# Patient Record
Sex: Female | Born: 1975 | Race: Black or African American | Hispanic: No | Marital: Married | Smoking: Never smoker
Health system: Southern US, Community
[De-identification: ages and names within clinical notes are randomized; demographics above are authoritative.]

## PROBLEM LIST (undated history)

## (undated) DIAGNOSIS — D219 Benign neoplasm of connective and other soft tissue, unspecified: Secondary | ICD-10-CM

## (undated) DIAGNOSIS — F419 Anxiety disorder, unspecified: Secondary | ICD-10-CM

## (undated) DIAGNOSIS — K219 Gastro-esophageal reflux disease without esophagitis: Secondary | ICD-10-CM

## (undated) DIAGNOSIS — R519 Headache, unspecified: Secondary | ICD-10-CM

## (undated) DIAGNOSIS — J302 Other seasonal allergic rhinitis: Secondary | ICD-10-CM

## (undated) DIAGNOSIS — R51 Headache: Secondary | ICD-10-CM

## (undated) DIAGNOSIS — Z8759 Personal history of other complications of pregnancy, childbirth and the puerperium: Secondary | ICD-10-CM

## (undated) DIAGNOSIS — R109 Unspecified abdominal pain: Secondary | ICD-10-CM

## (undated) HISTORY — DX: Personal history of other complications of pregnancy, childbirth and the puerperium: Z87.59

## (undated) HISTORY — DX: Unspecified abdominal pain: R10.9

## (undated) HISTORY — PX: UPPER GI ENDOSCOPY: SHX6162

## (undated) HISTORY — DX: Benign neoplasm of connective and other soft tissue, unspecified: D21.9

## (undated) HISTORY — PX: WISDOM TOOTH EXTRACTION: SHX21

## (undated) HISTORY — PX: HAND SURGERY: SHX662

---

## 2009-11-28 ENCOUNTER — Emergency Department (HOSPITAL_COMMUNITY): Admission: EM | Admit: 2009-11-28 | Discharge: 2009-11-28 | Payer: Self-pay | Admitting: Emergency Medicine

## 2013-07-18 HISTORY — PX: DILATION AND CURETTAGE OF UTERUS: SHX78

## 2016-01-26 ENCOUNTER — Encounter: Payer: Self-pay | Admitting: *Deleted

## 2016-01-28 ENCOUNTER — Encounter: Payer: Self-pay | Admitting: Obstetrics and Gynecology

## 2016-02-05 ENCOUNTER — Encounter: Payer: Medicaid Other | Admitting: Obstetrics and Gynecology

## 2016-06-22 ENCOUNTER — Emergency Department (HOSPITAL_COMMUNITY): Payer: Self-pay

## 2016-06-22 ENCOUNTER — Encounter (HOSPITAL_COMMUNITY): Payer: Self-pay | Admitting: Emergency Medicine

## 2016-06-22 ENCOUNTER — Emergency Department (HOSPITAL_COMMUNITY)
Admission: EM | Admit: 2016-06-22 | Discharge: 2016-06-22 | Disposition: A | Discharge status: Home / Self Care | Payer: Self-pay | Attending: Emergency Medicine | Admitting: Emergency Medicine

## 2016-06-22 DIAGNOSIS — Z79899 Other long term (current) drug therapy: Secondary | ICD-10-CM | POA: Insufficient documentation

## 2016-06-22 DIAGNOSIS — R0789 Other chest pain: Secondary | ICD-10-CM | POA: Insufficient documentation

## 2016-06-22 LAB — LIPASE, BLOOD: Lipase: 17 U/L (ref 11–51)

## 2016-06-22 LAB — COMPREHENSIVE METABOLIC PANEL
ALT: 17 U/L (ref 14–54)
AST: 16 U/L (ref 15–41)
Albumin: 4.3 g/dL (ref 3.5–5.0)
Alkaline Phosphatase: 65 U/L (ref 38–126)
Anion gap: 8 (ref 5–15)
BUN: 14 mg/dL (ref 6–20)
CO2: 24 mmol/L (ref 22–32)
Calcium: 9.6 mg/dL (ref 8.9–10.3)
Chloride: 107 mmol/L (ref 101–111)
Creatinine, Ser: 0.95 mg/dL (ref 0.44–1.00)
GFR calc Af Amer: >60 mL/min (ref >60)
GFR calc non Af Amer: >60 mL/min (ref >60)
Glucose, Bld: 102 mg/dL — ABNORMAL HIGH (ref 65–99)
Potassium: 3.8 mmol/L (ref 3.5–5.1)
Sodium: 139 mmol/L (ref 135–145)
Total Bilirubin: 0.4 mg/dL (ref 0.3–1.2)
Total Protein: 8.1 g/dL (ref 6.5–8.1)

## 2016-06-22 LAB — CBC WITH DIFFERENTIAL/PLATELET
Basophils Absolute: 0 10*3/uL (ref 0.0–0.1)
Basophils Relative: 1 %
Eosinophils Absolute: 0.1 10*3/uL (ref 0.0–0.7)
Eosinophils Relative: 1 %
HCT: 41.8 % (ref 36.0–46.0)
Hemoglobin: 14 g/dL (ref 12.0–15.0)
Lymphocytes Relative: 41 %
Lymphs Abs: 2.4 10*3/uL (ref 0.7–4.0)
MCH: 30 pg (ref 26.0–34.0)
MCHC: 33.5 g/dL (ref 30.0–36.0)
MCV: 89.5 fL (ref 78.0–100.0)
Monocytes Absolute: 0.5 10*3/uL (ref 0.1–1.0)
Monocytes Relative: 8 %
Neutro Abs: 2.9 10*3/uL (ref 1.7–7.7)
Neutrophils Relative %: 49 %
Platelets: 235 10*3/uL (ref 150–400)
RBC: 4.67 MIL/uL (ref 3.87–5.11)
RDW: 13 % (ref 11.5–15.5)
WBC: 5.9 10*3/uL (ref 4.0–10.5)

## 2016-06-22 MED ORDER — ACETAMINOPHEN 325 MG PO TABS
650.0000 mg | ORAL_TABLET | Freq: Once | ORAL | Status: AC
  Administered 2016-06-22: 650 mg via ORAL
  Filled 2016-06-22: qty 2

## 2016-06-22 MED ORDER — GI COCKTAIL ~~LOC~~
30.0000 mL | Freq: Once | ORAL | Status: AC
  Administered 2016-06-22: 30 mL via ORAL
  Filled 2016-06-22: qty 30

## 2016-06-22 MED ORDER — OMEPRAZOLE 20 MG PO CPDR: 20.0000 mg | DELAYED_RELEASE_CAPSULE | Freq: Every day | ORAL | 0 refills | Status: DC

## 2016-06-22 MED ORDER — FAMOTIDINE-CA CARB-MAG HYDROX 10-800-165 MG PO CHEW: 1.0000 | CHEWABLE_TABLET | Freq: Two times a day (BID) | ORAL | 0 refills | Status: DC | PRN

## 2016-06-22 NOTE — ED Provider Notes (Signed)
[facility] DEPT Provider Note   CSN: [id number] Arrival date & time: 06/22/16  1754     History   Chief Complaint Chief Complaint  Patient presents with  . Gastroesophageal Reflux    HPI Maria Stein is a 40 y.o. female.  HPI  Patient presents with concern of chest pain. The pain is sternal, sore, radiating superiorly.  The pain has been present for about 4 days, and with some associated pain about her axilla, patient went to urgent care 2 days ago. She was started on Tamiflu. She notes that other than initiation of nausea, generalized discomfort, she has been otherwise in a similar state affairs. No fever, no chills, though the patient states she is always cold. No other new pain, no confusion, disorientation, syncope.    Past Medical History:  Diagnosis Date  . Abdominal pain   . Fibroids   . History of miscarriage     There are no active problems to display for this patient.   Past Surgical History:  Procedure Laterality Date  . DILATION AND CURETTAGE OF UTERUS  2015  . HAND SURGERY      OB History    Gravida Para Term Preterm AB Living   1       1 0   SAB TAB Ectopic Multiple Live Births   1               Home Medications    Prior to Admission medications   Medication Sig Start Date End Date Taking? Authorizing Provider  medroxyPROGESTERone (DEPO-PROVERA) 150 MG/ML injection Inject 150 mg into the muscle every 3 (three) months.    Historical Provider, MD  omeprazole (PRILOSEC) 20 MG capsule Take 20 mg by mouth 2 (two) times daily before a meal.    Historical Provider, MD  promethazine (PHENERGAN) 25 MG tablet Take 25 mg by mouth every 6 (six) hours as needed for nausea or vomiting.    Historical Provider, MD    Family History Family History  Problem Relation Age of Onset  . Heart disease Father   . Hypertension Father   . Diabetes Father     Social History Social History  Substance Use Topics  . Smoking status: Never Smoker  .  Smokeless tobacco: Never Used  . Alcohol use No     Allergies   Hydrocodone   Review of Systems Review of Systems  Constitutional:       Per HPI, otherwise negative  HENT:       Per HPI, otherwise negative  Respiratory:       Per HPI, otherwise negative  Cardiovascular:       Per HPI, otherwise negative  Gastrointestinal: Negative for vomiting.  Endocrine:       Negative aside from HPI  Genitourinary:       Neg aside from HPI   Musculoskeletal:       Per HPI, otherwise negative  Skin: Negative.   Neurological: Negative for syncope.     Physical Exam Updated Vital Signs BP 145/96 (BP Location: Left Arm)   Pulse 92   Temp 98 F (36.7 C) (Oral)   Resp 18   Ht 5\' 6"  (1.676 m)   Wt 180 lb (81.6 kg)   SpO2 100%   BMI 29.05 kg/m   Physical Exam  Constitutional: She is oriented to person, place, and time. She appears well-developed and well-nourished. No distress.  HENT:  Head: Normocephalic and atraumatic.  Eyes: Conjunctivae and EOM are normal.  Cardiovascular:  Normal rate and regular rhythm.   Pulmonary/Chest: Effort normal and breath sounds normal. No stridor. No respiratory distress.  Abdominal: She exhibits no distension.  Musculoskeletal: She exhibits no edema.  Neurological: She is alert and oriented to person, place, and time. No cranial nerve deficit.  Skin: Skin is warm and dry.  Psychiatric: She has a normal mood and affect.  Nursing note and vitals reviewed.    ED Treatments / Results  Labs (all labs ordered are listed, but only abnormal results are displayed) Labs Reviewed  COMPREHENSIVE METABOLIC PANEL - Abnormal; Notable for the following:       Result Value   Glucose, Bld 102 (*)    All other components within normal limits  LIPASE, BLOOD  CBC WITH DIFFERENTIAL/PLATELET    EKG  EKG Interpretation  Date/Time:  Wednesday June 22 2016 18:46:21 EST Ventricular Rate:  86 PR Interval:    QRS Duration: 68 QT Interval:  335 QTC  Calculation: 401 R Axis:   64 Text Interpretation:  Sinus rhythm Biatrial enlargement Otherwise within normal limits Confirmed by Carmin Muskrat  MD [phone]) on 06/22/2016 6:55:11 PM       Radiology Dg Chest 2 View  Result Date: 06/22/2016 CLINICAL DATA:  Chest pain EXAM: CHEST  2 VIEW COMPARISON:  09/11/2015 FINDINGS: The heart size and mediastinal contours are within normal limits. Both lungs are clear. The visualized skeletal structures are unremarkable. IMPRESSION: No active cardiopulmonary disease. Electronically Signed   By: Franchot Gallo M.D.   On: 06/22/2016 19:47    Procedures Procedures (including critical care time)  Medications Ordered in ED Medications  gi cocktail (Maalox,Lidocaine,Donnatal) (not administered)     Initial Impression / Assessment and Plan / ED Course  I have reviewed the triage vital signs and the nursing notes.  Pertinent labs & imaging results that were available during my care of the patient were reviewed by me and considered in my medical decision making (see chart for details).  Clinical Course   On repeat exam patient is awake and alert in no distress. She is speaking clearly, states that she feels somewhat better after GI cocktail. We discussed all findings at length, including reassuring labs, EKG, x-ray. With description of sternal and epigastric discomfort, reassuring labs, empiric therapy for gastroesophageal irritation will start. Patient will follow up with GI, primary care.   Final Clinical Impressions(s) / ED Diagnoses  Atypical chest pain   Carmin Muskrat, MD 06/22/16 2052

## 2016-06-22 NOTE — Discharge Instructions (Signed)
As discussed, your evaluation today has been largely reassuring.  But, it is important that you monitor your condition carefully, and do not hesitate to return to the ED if you develop new, or concerning changes in your condition.  Please take all medication as directed and be sure to follow-up with your primary care physician and our gastroenterologist as needed.

## 2016-06-22 NOTE — ED Triage Notes (Signed)
Upper gastric pain for about 4 days.  History GI problem in Feb 2017.  Pain in neck and upper gastric and around to back.  Rates pain 8/10, but pain comes and goes.

## 2016-06-25 ENCOUNTER — Emergency Department (HOSPITAL_COMMUNITY): Payer: Self-pay

## 2016-06-25 ENCOUNTER — Emergency Department (HOSPITAL_COMMUNITY)
Admission: EM | Admit: 2016-06-25 | Discharge: 2016-06-25 | Disposition: A | Discharge status: Home / Self Care | Payer: Self-pay | Attending: Emergency Medicine | Admitting: Emergency Medicine

## 2016-06-25 ENCOUNTER — Encounter (HOSPITAL_COMMUNITY): Payer: Self-pay | Admitting: *Deleted

## 2016-06-25 DIAGNOSIS — R110 Nausea: Secondary | ICD-10-CM | POA: Insufficient documentation

## 2016-06-25 DIAGNOSIS — R109 Unspecified abdominal pain: Secondary | ICD-10-CM

## 2016-06-25 DIAGNOSIS — R011 Cardiac murmur, unspecified: Secondary | ICD-10-CM | POA: Insufficient documentation

## 2016-06-25 DIAGNOSIS — R197 Diarrhea, unspecified: Secondary | ICD-10-CM | POA: Insufficient documentation

## 2016-06-25 DIAGNOSIS — Z79899 Other long term (current) drug therapy: Secondary | ICD-10-CM | POA: Insufficient documentation

## 2016-06-25 DIAGNOSIS — R1013 Epigastric pain: Secondary | ICD-10-CM | POA: Insufficient documentation

## 2016-06-25 DIAGNOSIS — R1033 Periumbilical pain: Secondary | ICD-10-CM | POA: Insufficient documentation

## 2016-06-25 LAB — CBC WITH DIFFERENTIAL/PLATELET
Basophils Absolute: 0 10*3/uL (ref 0.0–0.1)
Basophils Relative: 0 %
Eosinophils Absolute: 0 10*3/uL (ref 0.0–0.7)
Eosinophils Relative: 0 %
HCT: 41.1 % (ref 36.0–46.0)
Hemoglobin: 13.8 g/dL (ref 12.0–15.0)
Lymphocytes Relative: 31 %
Lymphs Abs: 1.4 10*3/uL (ref 0.7–4.0)
MCH: 30 pg (ref 26.0–34.0)
MCHC: 33.6 g/dL (ref 30.0–36.0)
MCV: 89.3 fL (ref 78.0–100.0)
Monocytes Absolute: 0.3 10*3/uL (ref 0.1–1.0)
Monocytes Relative: 6 %
Neutro Abs: 2.8 10*3/uL (ref 1.7–7.7)
Neutrophils Relative %: 63 %
Platelets: 226 10*3/uL (ref 150–400)
RBC: 4.6 MIL/uL (ref 3.87–5.11)
RDW: 12.9 % (ref 11.5–15.5)
WBC: 4.4 10*3/uL (ref 4.0–10.5)

## 2016-06-25 LAB — URINALYSIS, ROUTINE W REFLEX MICROSCOPIC
Bilirubin Urine: NEGATIVE
Glucose, UA: NEGATIVE mg/dL
Ketones, ur: 20 mg/dL — AB
Nitrite: NEGATIVE
Protein, ur: NEGATIVE mg/dL
Specific Gravity, Urine: 1.017 (ref 1.005–1.030)
pH: 6 (ref 5.0–8.0)

## 2016-06-25 LAB — COMPREHENSIVE METABOLIC PANEL
ALT: 17 U/L (ref 14–54)
AST: 18 U/L (ref 15–41)
Albumin: 4.6 g/dL (ref 3.5–5.0)
Alkaline Phosphatase: 69 U/L (ref 38–126)
Anion gap: 10 (ref 5–15)
BUN: 14 mg/dL (ref 6–20)
CO2: 24 mmol/L (ref 22–32)
Calcium: 9.9 mg/dL (ref 8.9–10.3)
Chloride: 105 mmol/L (ref 101–111)
Creatinine, Ser: 1.02 mg/dL — ABNORMAL HIGH (ref 0.44–1.00)
GFR calc Af Amer: >60 mL/min (ref >60)
GFR calc non Af Amer: >60 mL/min (ref >60)
Glucose, Bld: 100 mg/dL — ABNORMAL HIGH (ref 65–99)
Potassium: 4 mmol/L (ref 3.5–5.1)
Sodium: 139 mmol/L (ref 135–145)
Total Bilirubin: 1 mg/dL (ref 0.3–1.2)
Total Protein: 8.1 g/dL (ref 6.5–8.1)

## 2016-06-25 LAB — LIPASE, BLOOD: Lipase: 17 U/L (ref 11–51)

## 2016-06-25 LAB — POC OCCULT BLOOD, ED: Fecal Occult Bld: NEGATIVE

## 2016-06-25 LAB — TROPONIN I: Troponin I: <0.03 ng/mL (ref <0.03)

## 2016-06-25 MED ORDER — GI COCKTAIL ~~LOC~~
30.0000 mL | Freq: Once | ORAL | Status: AC
  Administered 2016-06-25: 30 mL via ORAL
  Filled 2016-06-25: qty 30

## 2016-06-25 MED ORDER — PROMETHAZINE HCL 12.5 MG PO TABS: 12.5000 mg | ORAL_TABLET | Freq: Four times a day (QID) | ORAL | 0 refills | Status: DC | PRN

## 2016-06-25 MED ORDER — ONDANSETRON HCL 4 MG/2ML IJ SOLN
4.0000 mg | Freq: Once | INTRAMUSCULAR | Status: AC
  Administered 2016-06-25: 4 mg via INTRAVENOUS
  Filled 2016-06-25: qty 2

## 2016-06-25 MED ORDER — PANTOPRAZOLE SODIUM 40 MG PO TBEC
40.0000 mg | DELAYED_RELEASE_TABLET | Freq: Once | ORAL | Status: AC
  Administered 2016-06-25: 40 mg via ORAL
  Filled 2016-06-25: qty 1

## 2016-06-25 NOTE — ED Triage Notes (Signed)
Pt reports she was seen here Thursday for CP and diagnosed with GERD. Pt reports the pain hasn't gotten any better with the OTC Prilosec and she has started having abdominal and neck pain, decreased appetite, nausea. Pt denies vomiting.

## 2016-06-25 NOTE — ED Provider Notes (Signed)
[facility] DEPT Provider Note   CSN: [id number] Arrival date & time: 06/25/16  0802     History   Chief Complaint Chief Complaint  Patient presents with  . Abdominal Pain    HPI Maria Stein is a 40 y.o. female.  Patient is a 40 year old female who presents to the emergency department with a complaint of abdominal discomfort.  The patient states this problem is been going on for over a week. She started out having abdominal pain and then some chest discomfort. She describes a burning pain, a cramping type pain, and at times a pulling type sensation from her neck into her chest. She describes a burning and cramping type sensation that is diffuse throughout the abdomen. She has frequent bouts with nausea. She denies palpitations, shortness of breath, loss of consciousness. It is of note that the patient states that she uses ibuprofen occasionally, but only describes using 2 or 3 tablets a week. She denies use of any aspirin or Goody powders. She's not had any injury or trauma to the abdomen. She's not had any previous operations to the upper abdomen. She does report some diarrhea, but notes that this was after using Pepcid and Prilosec. She feels as though she is unable to eat. She has a poor appetite even though she is hungry. She was seen in the emergency department 3 days ago. She was diagnosed with esophageal reflux disease. She was placed on Pepcid and Prilosec. She was also seen by her primary physician this week, her blood pressure was noted to be elevated at that time. No changes were made in her medication, and no additional medicines were ordered concerning her abdomen and chest pain. She continues to have nausea this morning. The patient states she feels as though she is getting worse instead of getting better and she presents to the emergency department for additional evaluation and management.      Past Medical History:  Diagnosis Date  . Abdominal pain   . Fibroids     . History of miscarriage     There are no active problems to display for this patient.   Past Surgical History:  Procedure Laterality Date  . DILATION AND CURETTAGE OF UTERUS  2015  . HAND SURGERY      OB History    Gravida Para Term Preterm AB Living   1       1 0   SAB TAB Ectopic Multiple Live Births   1               Home Medications    Prior to Admission medications   Medication Sig Start Date End Date Taking? Authorizing Provider  calcium carbonate (TUMS - DOSED IN MG ELEMENTAL CALCIUM) 500 MG chewable tablet Chew 1 tablet by mouth daily as needed for indigestion or heartburn.    Historical Provider, MD  famotidine-calcium carbonate-magnesium hydroxide (PEPCID COMPLETE) 10-800-165 MG chewable tablet Chew 1 tablet by mouth 2 (two) times daily as needed. 06/22/16 06/29/16  Carmin Muskrat, MD  medroxyPROGESTERone (DEPO-PROVERA) 150 MG/ML injection Inject 150 mg into the muscle every 3 (three) months.    Historical Provider, MD  Multiple Vitamin (MULTIVITAMIN WITH MINERALS) TABS tablet Take 1 tablet by mouth daily.    Historical Provider, MD  omeprazole (PRILOSEC) 20 MG capsule Take 1 capsule (20 mg total) by mouth daily. Take one tablet daily 06/22/16   Carmin Muskrat, MD    Family History Family History  Problem Relation Age of Onset  . Heart  disease Father   . Hypertension Father   . Diabetes Father     Social History Social History  Substance Use Topics  . Smoking status: Never Smoker  . Smokeless tobacco: Never Used  . Alcohol use No     Allergies   Hydrocodone   Review of Systems Review of Systems  Gastrointestinal: Positive for abdominal pain, diarrhea and nausea.  All other systems reviewed and are negative.    Physical Exam Updated Vital Signs BP (!) 162/102   Pulse 91   Temp 97.7 F (36.5 C) (Oral)   Resp 16   Ht 5\' 6"  (1.676 m)   Wt 81.6 kg   SpO2 97%   BMI 29.05 kg/m   Physical Exam  Constitutional: She is oriented to person,  place, and time. She appears well-developed and well-nourished.  Non-toxic appearance.  HENT:  Head: Normocephalic.  Right Ear: Tympanic membrane and external ear normal.  Left Ear: Tympanic membrane and external ear normal.  Eyes: EOM and lids are normal. Pupils are equal, round, and reactive to light.  Neck: Normal range of motion. Neck supple. Carotid bruit is not present.  Cardiovascular: Normal rate, regular rhythm, intact distal pulses and normal pulses.  Exam reveals no gallop and no friction rub.   Murmur heard.  Systolic murmur is present with a grade of 2/6  Pulmonary/Chest: Breath sounds normal. No respiratory distress. She has no wheezes. She has no rales.  Abdominal: Soft. Bowel sounds are normal. She exhibits no distension, no fluid wave, no abdominal bruit, no ascites and no mass. There is tenderness in the epigastric area and periumbilical area. There is no guarding.    Genitourinary:  Genitourinary Comments: Chaperone present during the examination.  No rash or external hemorrhoid or lesions noted in the anal area. No palpable mass of the sphincter of the anal area. No blood on my gloved finger. Stool is negative for occult blood.  Musculoskeletal: Normal range of motion. She exhibits no edema or tenderness.  No Edema. Neg Homan's sign.  Lymphadenopathy:       Head (right side): No submandibular adenopathy present.       Head (left side): No submandibular adenopathy present.    She has no cervical adenopathy.  Neurological: She is alert and oriented to person, place, and time. She has normal strength. No cranial nerve deficit or sensory deficit.  Skin: Skin is warm and dry.  Psychiatric: She has a normal mood and affect. Her speech is normal.  Nursing note and vitals reviewed.    ED Treatments / Results  Labs (all labs ordered are listed, but only abnormal results are displayed) Labs Reviewed  CBC WITH DIFFERENTIAL/PLATELET  COMPREHENSIVE METABOLIC PANEL  LIPASE,  BLOOD  URINALYSIS, ROUTINE W REFLEX MICROSCOPIC  TROPONIN I    EKG  EKG Interpretation None       Radiology No results found.  Procedures Procedures (including critical care time)  Medications Ordered in ED Medications  ondansetron (ZOFRAN) injection 4 mg (not administered)     Initial Impression / Assessment and Plan / ED Course  I have reviewed the triage vital signs and the nursing notes.  Pertinent labs & imaging results that were available during my care of the patient were reviewed by me and considered in my medical decision making (see chart for details).  Clinical Course     **I have reviewed nursing notes, vital signs, and all appropriate lab and imaging results for this patient.*  Final Clinical Impressions(s) / ED  Diagnoses  Blood pressure is elevated at 162/102, otherwise the vital signs within normal limits. Pulse oximetry is 97% on room air.  Complete blood count is well within normal limits.  Blood pressure improved to 130/85-140/89.   The creatinine is slightly elevated at 1.02, otherwise the comprehensive metabolic panel is within normal limits. The lipase is normal at 17. The troponin is negative for acute event at less then 0.03.  I discussed the radiographic, laboratory, and clinical findings with the patient in terms which she understands. Questions were answered. The patient states that she feels some better, but still has discomfort on. The patient will be given a GI cocktail and a Protonix tablet here in the emergency department. I've asked the patient to continue her Pepcid and Prilosec. Will add a promethazine every 6 hours. I've asked the patient to see the GI specialist Dr. Oneida Alar for additional evaluation concerning this abdominal discomfort. Feel that it is safe for the patient to be discharged home. Patient is invited to return to the emergency department if any changes, problems, or concerns.    Final diagnoses:  Abdominal pain,  unspecified abdominal location    New Prescriptions New Prescriptions   No medications on file     Lily Kocher, PA-C 06/25/16 [place], MD 06/26/16 [phone]

## 2016-06-25 NOTE — Discharge Instructions (Signed)
Your vital signs within normal limits. Your oxygen level is 100% on room air. The complete blood count, cardiac enzyme, and lipase test are all negative. The ultrasound is negative for gallstones. Your bile ducts seem to be non-distended, and within normal limits. Please continue the Pepcid and Prilosec. Please add Phenergan for nausea. Phenergan may cause drowsiness, please do not drive, drink alcohol, operate machinery, or participate in activities requiring concentration when taking this medication. Please see Dr.Fields to complete your workup of your abdominal pain.

## 2016-07-18 ENCOUNTER — Encounter (HOSPITAL_COMMUNITY): Payer: Self-pay

## 2016-07-18 ENCOUNTER — Emergency Department (HOSPITAL_COMMUNITY): Payer: BLUE CROSS/BLUE SHIELD

## 2016-07-18 ENCOUNTER — Emergency Department (HOSPITAL_COMMUNITY)
Admission: EM | Admit: 2016-07-18 | Discharge: 2016-07-19 | Disposition: A | Discharge status: Home / Self Care | Payer: BLUE CROSS/BLUE SHIELD | Attending: Emergency Medicine | Admitting: Emergency Medicine

## 2016-07-18 DIAGNOSIS — R079 Chest pain, unspecified: Secondary | ICD-10-CM | POA: Diagnosis present

## 2016-07-18 DIAGNOSIS — Z79899 Other long term (current) drug therapy: Secondary | ICD-10-CM | POA: Insufficient documentation

## 2016-07-18 DIAGNOSIS — R0789 Other chest pain: Secondary | ICD-10-CM | POA: Insufficient documentation

## 2016-07-18 LAB — CBC
HCT: 43 % (ref 36.0–46.0)
Hemoglobin: 15 g/dL (ref 12.0–15.0)
MCH: 29.8 pg (ref 26.0–34.0)
MCHC: 34.9 g/dL (ref 30.0–36.0)
MCV: 85.5 fL (ref 78.0–100.0)
Platelets: 185 10*3/uL (ref 150–400)
RBC: 5.03 MIL/uL (ref 3.87–5.11)
RDW: 12.6 % (ref 11.5–15.5)
WBC: 4.6 10*3/uL (ref 4.0–10.5)

## 2016-07-18 LAB — BASIC METABOLIC PANEL
Anion gap: 12 (ref 5–15)
BUN: 6 mg/dL (ref 6–20)
CO2: 21 mmol/L — ABNORMAL LOW (ref 22–32)
Calcium: 9.7 mg/dL (ref 8.9–10.3)
Chloride: 103 mmol/L (ref 101–111)
Creatinine, Ser: 0.99 mg/dL (ref 0.44–1.00)
GFR calc Af Amer: >60 mL/min (ref >60)
GFR calc non Af Amer: >60 mL/min (ref >60)
Glucose, Bld: 89 mg/dL (ref 65–99)
Potassium: 3.8 mmol/L (ref 3.5–5.1)
Sodium: 136 mmol/L (ref 135–145)

## 2016-07-18 LAB — I-STAT TROPONIN, ED: Troponin i, poc: 0 ng/mL (ref 0.00–0.08)

## 2016-07-18 NOTE — ED Notes (Signed)
Pt presents with 4 week hx of mid chest pain that radiates to lower back and neck.  She says it feels like something is stuck. Was told it was acid reflux.  Pt sts she has had constant nausea, which she got medication for, since this started and has been unable to eat normally.  Has been able to sip on clear liquids until today.

## 2016-07-18 NOTE — ED Provider Notes (Signed)
[facility] DEPT Provider Note   CSN: [id number] Arrival date & time: 07/18/16  2000     History   Chief Complaint Chief Complaint  Patient presents with  . Chest Pain    HPI Maria Stein is a 41 y.o. female.  HPI   Maria Stein is a 41 y.o. female, with a history of recurrent abdominal pain, presenting to the ED with chest, abdominal, and neck pain for the past 5 weeks. Pain in the neck feels like a "pulling from the chest." Pain in the abdomen is epigastric and "feels like bloating." Pt was seen in the [facility] ED on 12/9 for this same complaint, was given prescriptions for pepcid and prilosec. She took these as prescribed for 2 weeks without improvement. She has seen her PCP for this complaint and was switched to Zantac with no improvement. Pain is waxing and waning and accompanied by nausea. Pain does not change with eating. Pain is currently mild. Denies shortness of breath, ripping or tearing pain, vomiting, fever/chills, or any other complaints.  No family history of SCD, connective tissue disorders, or AAA. No personal history of cancer, connective tissue disorders, or AAA.   Past Medical History:  Diagnosis Date  . Abdominal pain   . Fibroids   . History of miscarriage     There are no active problems to display for this patient.   Past Surgical History:  Procedure Laterality Date  . DILATION AND CURETTAGE OF UTERUS  2015  . HAND SURGERY      OB History    Gravida Para Term Preterm AB Living   1       1 0   SAB TAB Ectopic Multiple Live Births   1               Home Medications    Prior to Admission medications   Medication Sig Start Date End Date Taking? Authorizing Provider  medroxyPROGESTERone (DEPO-PROVERA) 150 MG/ML injection Inject 150 mg into the muscle every 3 (three) months.   Yes Historical Provider, MD  Multiple Vitamin (MULTIVITAMIN WITH MINERALS) TABS tablet Take 1 tablet by mouth daily.   Yes Historical Provider, MD    famotidine-calcium carbonate-magnesium hydroxide (PEPCID COMPLETE) 10-800-165 MG chewable tablet Chew 1 tablet by mouth 2 (two) times daily as needed. Patient not taking: Reported on 07/18/2016 06/22/16 07/18/16  Carmin Muskrat, MD  omeprazole (PRILOSEC) 20 MG capsule Take 1 capsule (20 mg total) by mouth daily. Take one tablet daily Patient not taking: Reported on 07/18/2016 06/22/16   Carmin Muskrat, MD  promethazine (PHENERGAN) 12.5 MG tablet Take 1 tablet (12.5 mg total) by mouth every 6 (six) hours as needed for nausea or vomiting. Patient not taking: Reported on 07/18/2016 06/25/16   Lily Kocher, PA-C    Family History Family History  Problem Relation Age of Onset  . Heart disease Father   . Hypertension Father   . Diabetes Father     Social History Social History  Substance Use Topics  . Smoking status: Never Smoker  . Smokeless tobacco: Never Used  . Alcohol use No     Allergies   Hydrocodone   Review of Systems Review of Systems  Constitutional: Negative for chills and fever.  HENT:       Throat pain  Respiratory: Negative for cough and shortness of breath.   Gastrointestinal: Positive for abdominal pain and nausea. Negative for vomiting.  All other systems reviewed and are negative.    Physical Exam Updated Vital Signs  BP 150/95 (BP Location: Left Arm)   Pulse 112   Temp 98.9 F (37.2 C) (Oral)   Resp 18   Ht 5\' 6"  (1.676 m)   Wt 77.1 kg   SpO2 100%   BMI 27.44 kg/m   Physical Exam  Constitutional: She appears well-developed and well-nourished. No distress.  HENT:  Head: Normocephalic and atraumatic.  Eyes: Conjunctivae are normal.  Neck: Neck supple.  Cardiovascular: Normal rate, regular rhythm, normal heart sounds and intact distal pulses.   Pulmonary/Chest: Effort normal and breath sounds normal. No respiratory distress.  Abdominal: Soft. There is no tenderness. There is no guarding.  Musculoskeletal: She exhibits no edema.  Lymphadenopathy:     She has no cervical adenopathy.  Neurological: She is alert.  Skin: Skin is warm and dry. She is not diaphoretic.  Psychiatric: She has a normal mood and affect. Her behavior is normal.  Nursing note and vitals reviewed.    ED Treatments / Results  Labs (all labs ordered are listed, but only abnormal results are displayed) Labs Reviewed  BASIC METABOLIC PANEL - Abnormal; Notable for the following:       Result Value   CO2 21 (*)    All other components within normal limits  CBC  I-STAT TROPOININ, ED    EKG  EKG Interpretation  Date/Time:  Monday July 18 2016 20:06:46 EST Ventricular Rate:  107 PR Interval:  126 QRS Duration: 68 QT Interval:  300 QTC Calculation: 400 R Axis:   71 Text Interpretation:  Sinus tachycardia Biatrial enlargement Abnormal ECG Confirmed by Alvino Chapel  MD, Ovid Curd [phone]) on 07/18/2016 11:27:02 PM       Radiology Dg Chest 2 View  Result Date: 07/18/2016 CLINICAL DATA:  Chest pain over the last 5 weeks. EXAM: CHEST  2 VIEW COMPARISON:  06/22/2016 FINDINGS: Heart size is normal. Mediastinal shadows are normal. The lungs are clear. No bronchial thickening. No infiltrate, mass, effusion or collapse. Pulmonary vascularity is normal. No bony abnormality. IMPRESSION: Normal chest Electronically Signed   By: Nelson Chimes M.D.   On: 07/18/2016 21:39    Procedures Procedures (including critical care time)  Medications Ordered in ED Medications - No data to display   Initial Impression / Assessment and Plan / ED Course  I have reviewed the triage vital signs and the nursing notes.  Pertinent labs & imaging results that were available during my care of the patient were reviewed by me and considered in my medical decision making (see chart for details).  Clinical Course     Patient presents with epigastric, chest, and throat discomfort for the past 5 weeks. Lab results are reassuring. Patient's presentation, pain description, and lack of risk factors  give low suspicion for thoracic or abdominal aortic aneurysm. Gastroenterology follow-up. Strict return precautions discussed. Patient voiced understanding all instructions and is comfortable with discharge.  Vitals:   07/18/16 2010 07/18/16 2330 07/18/16 2339  BP: 150/95 123/88 128/90  Pulse: 112 86 91  Resp: 18 18 15   Temp: 98.9 F (37.2 C)    TempSrc: Oral    SpO2: 100% 98% 98%  Weight: 77.1 kg    Height: 5\' 6"  (1.676 m)        Final Clinical Impressions(s) / ED Diagnoses   Final diagnoses:  Chest discomfort    New Prescriptions New Prescriptions   No medications on file     Layla Maw 07/19/16 0017    Davonna Belling, MD 07/20/16 [phone]

## 2016-07-18 NOTE — ED Triage Notes (Signed)
Pt states that for the past five weeks she has been having CP, seen recently at [facility] and diagnosed with GERD, pt is generalized, pain radiates to neck and back, also c/o nausea, no vomiting.

## 2016-07-18 NOTE — Discharge Instructions (Signed)
There were no acute abnormalities on your labs or imaging today. Continue with the previously prescribed medication regimen. Avoid medications such as ibuprofen or naproxen. Follow-up with gastroenterology on this matter. Return to the ED should symptoms severely worsen.

## 2017-03-06 ENCOUNTER — Other Ambulatory Visit (HOSPITAL_COMMUNITY): Payer: Self-pay | Admitting: Gastroenterology

## 2017-03-06 DIAGNOSIS — R110 Nausea: Secondary | ICD-10-CM

## 2017-03-15 ENCOUNTER — Ambulatory Visit (HOSPITAL_COMMUNITY)
Admission: RE | Admit: 2017-03-15 | Discharge: 2017-03-15 | Disposition: A | Discharge status: Home / Self Care | Payer: BLUE CROSS/BLUE SHIELD | Source: Ambulatory Visit | Attending: Gastroenterology | Admitting: Gastroenterology

## 2017-03-15 DIAGNOSIS — R110 Nausea: Secondary | ICD-10-CM | POA: Diagnosis not present

## 2017-03-15 DIAGNOSIS — R6881 Early satiety: Secondary | ICD-10-CM | POA: Diagnosis not present

## 2017-03-15 DIAGNOSIS — K30 Functional dyspepsia: Secondary | ICD-10-CM | POA: Insufficient documentation

## 2017-03-15 MED ORDER — TECHNETIUM TC 99M SULFUR COLLOID
2.0000 | Freq: Once | INTRAVENOUS | Status: AC | PRN
  Administered 2017-03-15: 2 via ORAL

## 2017-05-12 ENCOUNTER — Other Ambulatory Visit: Payer: Self-pay | Admitting: Gynecology

## 2017-05-12 DIAGNOSIS — N6320 Unspecified lump in the left breast, unspecified quadrant: Secondary | ICD-10-CM

## 2017-05-19 ENCOUNTER — Ambulatory Visit
Admission: RE | Admit: 2017-05-19 | Discharge: 2017-05-19 | Disposition: A | Discharge status: Home / Self Care | Payer: BLUE CROSS/BLUE SHIELD | Source: Ambulatory Visit | Attending: Gynecology | Admitting: Gynecology

## 2017-05-19 ENCOUNTER — Other Ambulatory Visit: Payer: Self-pay | Admitting: Gynecology

## 2017-05-19 DIAGNOSIS — N6320 Unspecified lump in the left breast, unspecified quadrant: Secondary | ICD-10-CM

## 2017-05-19 DIAGNOSIS — N6489 Other specified disorders of breast: Secondary | ICD-10-CM | POA: Diagnosis not present

## 2017-05-19 DIAGNOSIS — R928 Other abnormal and inconclusive findings on diagnostic imaging of breast: Secondary | ICD-10-CM | POA: Diagnosis not present

## 2017-05-31 DIAGNOSIS — M545 Low back pain: Secondary | ICD-10-CM | POA: Diagnosis not present

## 2017-06-01 DIAGNOSIS — N920 Excessive and frequent menstruation with regular cycle: Secondary | ICD-10-CM | POA: Diagnosis not present

## 2017-06-01 DIAGNOSIS — D259 Leiomyoma of uterus, unspecified: Secondary | ICD-10-CM | POA: Diagnosis not present

## 2017-08-18 NOTE — Patient Instructions (Addendum)
Your procedure is scheduled on:  Wednesday, Feb 13  Enter through the [employer] [facility] at: 7 am  Pick up the phone at the desk and dial [phone].  Call this number if you have problems the morning of surgery: [phone].  Remember: Do NOT eat or Do NOT drink clear liquids (including water) after midnight Tuesday  Take these medicines the morning of surgery with a SIP OF WATER: Nexium  Stop herbal medications and supplements at this time.  Do NOT wear jewelry (body piercing), metal hair clips/bobby pins, make-up, or nail polish. Do NOT wear lotions, powders, or perfumes.  You may wear deoderant. Do NOT shave for 48 hours prior to surgery. Do NOT bring valuables to the hospital.  Leave suitcase in car.  After surgery it may be brought to your room.  For patients admitted to the hospital, checkout time is 11:00 AM the day of discharge. Have a responsible adult drive you home and stay with you for 24 hours after your procedure.  Home with Husband Nicole [employer] cell [phone].

## 2017-08-23 ENCOUNTER — Encounter (HOSPITAL_COMMUNITY): Payer: Self-pay

## 2017-08-23 ENCOUNTER — Encounter (HOSPITAL_COMMUNITY)
Admission: RE | Admit: 2017-08-23 | Discharge: 2017-08-23 | Disposition: A | Discharge status: Home / Self Care | Payer: BLUE CROSS/BLUE SHIELD | Source: Ambulatory Visit | Attending: Obstetrics and Gynecology | Admitting: Obstetrics and Gynecology

## 2017-08-23 ENCOUNTER — Other Ambulatory Visit: Payer: Self-pay

## 2017-08-23 DIAGNOSIS — Z01812 Encounter for preprocedural laboratory examination: Secondary | ICD-10-CM | POA: Diagnosis not present

## 2017-08-23 HISTORY — DX: Anxiety disorder, unspecified: F41.9

## 2017-08-23 HISTORY — DX: Gastro-esophageal reflux disease without esophagitis: K21.9

## 2017-08-23 HISTORY — DX: Headache, unspecified: R51.9

## 2017-08-23 HISTORY — DX: Headache: R51

## 2017-08-23 HISTORY — DX: Other seasonal allergic rhinitis: J30.2

## 2017-08-23 LAB — CBC
HCT: 38.6 % (ref 36.0–46.0)
Hemoglobin: 13.2 g/dL (ref 12.0–15.0)
MCH: 30.4 pg (ref 26.0–34.0)
MCHC: 34.2 g/dL (ref 30.0–36.0)
MCV: 88.9 fL (ref 78.0–100.0)
Platelets: 187 10*3/uL (ref 150–400)
RBC: 4.34 MIL/uL (ref 3.87–5.11)
RDW: 12.8 % (ref 11.5–15.5)
WBC: 5.1 10*3/uL (ref 4.0–10.5)

## 2017-08-23 LAB — BASIC METABOLIC PANEL
Anion gap: 6 (ref 5–15)
BUN: 17 mg/dL (ref 6–20)
CO2: 26 mmol/L (ref 22–32)
Calcium: 8.9 mg/dL (ref 8.9–10.3)
Chloride: 105 mmol/L (ref 101–111)
Creatinine, Ser: 1.03 mg/dL — ABNORMAL HIGH (ref 0.44–1.00)
GFR calc Af Amer: >60 mL/min (ref >60)
GFR calc non Af Amer: >60 mL/min (ref >60)
Glucose, Bld: 116 mg/dL — ABNORMAL HIGH (ref 65–99)
Potassium: 4 mmol/L (ref 3.5–5.1)
Sodium: 137 mmol/L (ref 135–145)

## 2017-08-23 LAB — TYPE AND SCREEN
ABO/RH(D): A POS
Antibody Screen: NEGATIVE

## 2017-08-23 LAB — ABO/RH: ABO/RH(D): A POS

## 2017-08-29 NOTE — H&P (Signed)
Maria Stein is an 42 y.o. female G1P0 presenting for schedule TLH/bilateral salpingectomies for known fibroid uterus with heavy menses.  . Recent US and w/u showed a large fibroid 6cm x 5cm displacing the cavity to the right.  Menses were very heavy and started Depo provera which has decreased flow, but now has unpredictable bleeding with one day of cramping and clotting that keeps her from work. She is healthy except for GERD. Took tramadol for pain in past but made her BP drop. Pt has decided she definitely does not want children would now like definitive surgery with a hysterecomy.   Pertinent Gynecological History: fibroids, menorrhagia  OB History: SAB x 1   Menstrual History:  No LMP recorded. Patient has had an injection.    Past Medical History:  Diagnosis Date  . Abdominal pain   . Anxiety    no meds  . Fibroids   . GERD (gastroesophageal reflux disease)   . Headache    otc med prn  . History of miscarriage   . Seasonal allergies     Past Surgical History:  Procedure Laterality Date  . DILATION AND CURETTAGE OF UTERUS  2015  . HAND SURGERY Right    plate/screws in right hand  . UPPER GI ENDOSCOPY    . WISDOM TOOTH EXTRACTION      Family History  Problem Relation Age of Onset  . Heart disease Father   . Hypertension Father   . Diabetes Father     Social History:  reports that  has never smoked. she has never used smokeless tobacco. She reports that she does not drink alcohol or use drugs.  Allergies:  Allergies  Allergen Reactions  . Hydrocodone Nausea And Vomiting    No medications prior to admission.    Review of Systems  Gastrointestinal: Negative for abdominal pain.  Genitourinary: Negative for hematuria.  Neurological: Negative for headaches.    There were no vitals taken for this visit. Physical Exam  Constitutional: She appears well-developed.  Cardiovascular: Normal rate and regular rhythm.  Respiratory: Effort normal.  GI:  Soft.  Genitourinary: Vagina normal.  Genitourinary Comments: Uterus enlarged with fibroids 8-10 weeks  Neurological: She is alert.  Psychiatric: She has a normal mood and affect.    No results found for this or any previous visit (from the past 24 hour(s)).  No results found.  Assessment/Plan: Pt was counseled on the risks and benfits of TLH/BS including bleeding, infection, and possible damage to bowel and bladder or ureters. She will retain her ovaries as long as no pathology noted. She would accept a transfusion if needed. We reviewed the procedure in detail including laparoscopic vs vaginal closure of cuff. We discussed a possible incision in the event of any complication and possible delayed recovery from that. She is ready to proceed.   Logan Bores 08/29/2017, 2:59 PM

## 2017-08-29 NOTE — Anesthesia Preprocedure Evaluation (Addendum)
Anesthesia Evaluation  Patient identified by MRN, date of birth, ID band Patient awake    Reviewed: Allergy & Precautions, H&P , NPO status , Patient's Chart, lab work & pertinent test results  Airway Mallampati: II  TM Distance: >3 FB Neck ROM: Full    Dental no notable dental hx. (+) Dental Advisory Given, Teeth Intact   Pulmonary neg pulmonary ROS,    Pulmonary exam normal breath sounds clear to auscultation       Cardiovascular negative cardio ROS   Rhythm:Regular Rate:Normal     Neuro/Psych  Headaches, Anxiety negative psych ROS   GI/Hepatic Neg liver ROS, GERD  Controlled and Medicated,  Endo/Other  negative endocrine ROS  Renal/GU negative Renal ROS  negative genitourinary   Musculoskeletal negative musculoskeletal ROS (+)   Abdominal   Peds  Hematology negative hematology ROS (+)   Anesthesia Other Findings   Reproductive/Obstetrics Fibroids                           Anesthesia Physical Anesthesia Plan  ASA: II  Anesthesia Plan: General   Post-op Pain Management:    Induction: Intravenous  PONV Risk Score and Plan: 3 and Dexamethasone, Ondansetron, Treatment may vary due to age or medical condition, Scopolamine patch - Pre-op and Midazolam  Airway Management Planned: Oral ETT  Additional Equipment: None  Intra-op Plan:   Post-operative Plan: Extubation in OR  Informed Consent: I have reviewed the patients History and Physical, chart, labs and discussed the procedure including the risks, benefits and alternatives for the proposed anesthesia with the patient or authorized representative who has indicated his/her understanding and acceptance.   Dental Advisory Given  Plan Discussed with: CRNA  Anesthesia Plan Comments: (  )       Anesthesia Quick Evaluation

## 2017-08-30 ENCOUNTER — Ambulatory Visit (HOSPITAL_COMMUNITY): Payer: BLUE CROSS/BLUE SHIELD | Admitting: Anesthesiology

## 2017-08-30 ENCOUNTER — Ambulatory Visit (HOSPITAL_COMMUNITY)
Admission: RE | Admit: 2017-08-30 | Discharge: 2017-08-31 | Disposition: A | Discharge status: Home / Self Care | Payer: BLUE CROSS/BLUE SHIELD | Source: Ambulatory Visit | Attending: Obstetrics and Gynecology | Admitting: Obstetrics and Gynecology

## 2017-08-30 ENCOUNTER — Encounter (HOSPITAL_COMMUNITY)
Admission: RE | Disposition: A | Discharge status: Home / Self Care | Payer: Self-pay | Source: Ambulatory Visit | Attending: Obstetrics and Gynecology

## 2017-08-30 ENCOUNTER — Encounter (HOSPITAL_COMMUNITY): Payer: Self-pay | Admitting: Emergency Medicine

## 2017-08-30 ENCOUNTER — Other Ambulatory Visit: Payer: Self-pay

## 2017-08-30 DIAGNOSIS — Z79899 Other long term (current) drug therapy: Secondary | ICD-10-CM | POA: Diagnosis not present

## 2017-08-30 DIAGNOSIS — N920 Excessive and frequent menstruation with regular cycle: Secondary | ICD-10-CM | POA: Diagnosis not present

## 2017-08-30 DIAGNOSIS — K219 Gastro-esophageal reflux disease without esophagitis: Secondary | ICD-10-CM | POA: Insufficient documentation

## 2017-08-30 DIAGNOSIS — N888 Other specified noninflammatory disorders of cervix uteri: Secondary | ICD-10-CM | POA: Insufficient documentation

## 2017-08-30 DIAGNOSIS — D259 Leiomyoma of uterus, unspecified: Secondary | ICD-10-CM

## 2017-08-30 DIAGNOSIS — Z90710 Acquired absence of both cervix and uterus: Secondary | ICD-10-CM | POA: Diagnosis present

## 2017-08-30 HISTORY — PX: TOTAL LAPAROSCOPIC HYSTERECTOMY WITH SALPINGECTOMY: SHX6742

## 2017-08-30 SURGERY — HYSTERECTOMY, TOTAL, LAPAROSCOPIC, WITH SALPINGECTOMY
Anesthesia: General | Site: Abdomen | Laterality: Bilateral

## 2017-08-30 MED ORDER — OXYCODONE HCL 5 MG PO TABS: 5.0000 mg | ORAL_TABLET | Freq: Once | ORAL | Status: DC | PRN

## 2017-08-30 MED ORDER — LACTATED RINGERS IV SOLN
INTRAVENOUS | Status: DC
  Administered 2017-08-30 (×2): via INTRAVENOUS

## 2017-08-30 MED ORDER — PROPOFOL 10 MG/ML IV BOLUS
INTRAVENOUS | Status: AC
  Filled 2017-08-30: qty 20

## 2017-08-30 MED ORDER — PROPOFOL 10 MG/ML IV BOLUS
INTRAVENOUS | Status: DC | PRN
  Administered 2017-08-30: 150 mg via INTRAVENOUS
  Administered 2017-08-30: 50 mg via INTRAVENOUS

## 2017-08-30 MED ORDER — FENTANYL CITRATE (PF) 100 MCG/2ML IJ SOLN
INTRAMUSCULAR | Status: AC
  Administered 2017-08-30: 50 ug via INTRAVENOUS
  Filled 2017-08-30: qty 2

## 2017-08-30 MED ORDER — ONDANSETRON HCL 4 MG PO TABS: 4.0000 mg | ORAL_TABLET | Freq: Four times a day (QID) | ORAL | Status: DC | PRN

## 2017-08-30 MED ORDER — SODIUM CHLORIDE 0.9 % IJ SOLN
INTRAMUSCULAR | Status: AC
  Filled 2017-08-30: qty 10

## 2017-08-30 MED ORDER — HYDROMORPHONE HCL 1 MG/ML IJ SOLN
INTRAMUSCULAR | Status: AC
  Filled 2017-08-30: qty 1

## 2017-08-30 MED ORDER — BUPIVACAINE HCL (PF) 0.25 % IJ SOLN
INTRAMUSCULAR | Status: AC
  Filled 2017-08-30: qty 30

## 2017-08-30 MED ORDER — DIPHENHYDRAMINE HCL 25 MG PO CAPS
25.0000 mg | ORAL_CAPSULE | Freq: Four times a day (QID) | ORAL | Status: DC | PRN
  Administered 2017-08-30: 25 mg via ORAL
  Filled 2017-08-30: qty 1

## 2017-08-30 MED ORDER — KETOROLAC TROMETHAMINE 30 MG/ML IJ SOLN: 30.0000 mg | Freq: Four times a day (QID) | INTRAMUSCULAR | Status: DC

## 2017-08-30 MED ORDER — SODIUM CHLORIDE 0.9 % IJ SOLN
INTRAMUSCULAR | Status: DC | PRN
  Administered 2017-08-30: 10 mL

## 2017-08-30 MED ORDER — DEXAMETHASONE SODIUM PHOSPHATE 4 MG/ML IJ SOLN
INTRAMUSCULAR | Status: AC
  Filled 2017-08-30: qty 1

## 2017-08-30 MED ORDER — MIDAZOLAM HCL 2 MG/2ML IJ SOLN
INTRAMUSCULAR | Status: AC
  Filled 2017-08-30: qty 2

## 2017-08-30 MED ORDER — HYDROMORPHONE HCL 1 MG/ML IJ SOLN
INTRAMUSCULAR | Status: DC | PRN
  Administered 2017-08-30: 1 mg via INTRAVENOUS

## 2017-08-30 MED ORDER — ONDANSETRON HCL 4 MG/2ML IJ SOLN
INTRAMUSCULAR | Status: DC | PRN
  Administered 2017-08-30: 4 mg via INTRAVENOUS

## 2017-08-30 MED ORDER — MENTHOL 3 MG MT LOZG: 1.0000 | LOZENGE | OROMUCOSAL | Status: DC | PRN

## 2017-08-30 MED ORDER — SUGAMMADEX SODIUM 200 MG/2ML IV SOLN
INTRAVENOUS | Status: DC | PRN
Start: 2017-08-30 — End: 2017-08-30
  Administered 2017-08-30: 200 mg via INTRAVENOUS

## 2017-08-30 MED ORDER — SUGAMMADEX SODIUM 200 MG/2ML IV SOLN
INTRAVENOUS | Status: AC
  Filled 2017-08-30: qty 2

## 2017-08-30 MED ORDER — KETOROLAC TROMETHAMINE 30 MG/ML IJ SOLN
INTRAMUSCULAR | Status: AC
  Filled 2017-08-30: qty 1

## 2017-08-30 MED ORDER — LACTATED RINGERS IV SOLN
INTRAVENOUS | Status: DC
  Administered 2017-08-30: 18:00:00 via INTRAVENOUS

## 2017-08-30 MED ORDER — KETOROLAC TROMETHAMINE 30 MG/ML IJ SOLN
30.0000 mg | Freq: Four times a day (QID) | INTRAMUSCULAR | Status: DC
  Administered 2017-08-30 – 2017-08-31 (×3): 30 mg via INTRAVENOUS
  Filled 2017-08-30 (×3): qty 1

## 2017-08-30 MED ORDER — FENTANYL CITRATE (PF) 100 MCG/2ML IJ SOLN
25.0000 ug | INTRAMUSCULAR | Status: DC | PRN
  Administered 2017-08-30: 50 ug via INTRAVENOUS

## 2017-08-30 MED ORDER — HYDROMORPHONE HCL 1 MG/ML IJ SOLN: 0.2000 mg | INTRAMUSCULAR | Status: DC | PRN

## 2017-08-30 MED ORDER — MIDAZOLAM HCL 2 MG/2ML IJ SOLN
INTRAMUSCULAR | Status: DC | PRN
  Administered 2017-08-30: 2 mg via INTRAVENOUS

## 2017-08-30 MED ORDER — LACTATED RINGERS IV SOLN
INTRAVENOUS | Status: DC
  Administered 2017-08-30 (×2): via INTRAVENOUS

## 2017-08-30 MED ORDER — CEFAZOLIN SODIUM-DEXTROSE 2-4 GM/100ML-% IV SOLN
INTRAVENOUS | Status: AC
  Filled 2017-08-30: qty 100

## 2017-08-30 MED ORDER — LIDOCAINE HCL (CARDIAC) 20 MG/ML IV SOLN
INTRAVENOUS | Status: DC | PRN
  Administered 2017-08-30: 60 mg via INTRAVENOUS

## 2017-08-30 MED ORDER — OXYCODONE HCL 5 MG/5ML PO SOLN: 5.0000 mg | Freq: Once | ORAL | Status: DC | PRN

## 2017-08-30 MED ORDER — ONDANSETRON HCL 4 MG/2ML IJ SOLN
INTRAMUSCULAR | Status: AC
  Filled 2017-08-30: qty 2

## 2017-08-30 MED ORDER — KETOROLAC TROMETHAMINE 30 MG/ML IJ SOLN
INTRAMUSCULAR | Status: DC | PRN
  Administered 2017-08-30: 30 mg via INTRAVENOUS

## 2017-08-30 MED ORDER — PANTOPRAZOLE SODIUM 40 MG PO TBEC: 40.0000 mg | DELAYED_RELEASE_TABLET | Freq: Every day | ORAL | Status: DC

## 2017-08-30 MED ORDER — FENTANYL CITRATE (PF) 250 MCG/5ML IJ SOLN
INTRAMUSCULAR | Status: AC
  Filled 2017-08-30: qty 5

## 2017-08-30 MED ORDER — PROMETHAZINE HCL 25 MG/ML IJ SOLN
INTRAMUSCULAR | Status: AC
  Administered 2017-08-30: 6.25 mg
  Filled 2017-08-30: qty 1

## 2017-08-30 MED ORDER — ONDANSETRON HCL 4 MG/2ML IJ SOLN: 4.0000 mg | Freq: Four times a day (QID) | INTRAMUSCULAR | Status: DC | PRN

## 2017-08-30 MED ORDER — SCOPOLAMINE 1 MG/3DAYS TD PT72
MEDICATED_PATCH | TRANSDERMAL | Status: AC
  Administered 2017-08-30: 1.5 mg via TRANSDERMAL
  Filled 2017-08-30: qty 1

## 2017-08-30 MED ORDER — PROMETHAZINE HCL 25 MG/ML IJ SOLN: 6.2500 mg | INTRAMUSCULAR | Status: DC | PRN

## 2017-08-30 MED ORDER — OXYCODONE-ACETAMINOPHEN 5-325 MG PO TABS
1.0000 | ORAL_TABLET | ORAL | Status: DC | PRN
  Administered 2017-08-30: 1 via ORAL
  Filled 2017-08-30: qty 1

## 2017-08-30 MED ORDER — IBUPROFEN 600 MG PO TABS: 600.0000 mg | ORAL_TABLET | Freq: Four times a day (QID) | ORAL | Status: DC | PRN

## 2017-08-30 MED ORDER — SIMETHICONE 80 MG PO CHEW: 80.0000 mg | CHEWABLE_TABLET | Freq: Four times a day (QID) | ORAL | Status: DC | PRN

## 2017-08-30 MED ORDER — BUPIVACAINE HCL (PF) 0.25 % IJ SOLN
INTRAMUSCULAR | Status: DC | PRN
  Administered 2017-08-30: 22 mL

## 2017-08-30 MED ORDER — DEXAMETHASONE SODIUM PHOSPHATE 10 MG/ML IJ SOLN
INTRAMUSCULAR | Status: DC | PRN
  Administered 2017-08-30: 4 mg via INTRAVENOUS

## 2017-08-30 MED ORDER — SCOPOLAMINE 1 MG/3DAYS TD PT72
1.0000 | MEDICATED_PATCH | Freq: Once | TRANSDERMAL | Status: DC
  Administered 2017-08-30: 1.5 mg via TRANSDERMAL

## 2017-08-30 MED ORDER — ROCURONIUM BROMIDE 100 MG/10ML IV SOLN
INTRAVENOUS | Status: AC
  Filled 2017-08-30: qty 1

## 2017-08-30 MED ORDER — ROCURONIUM BROMIDE 100 MG/10ML IV SOLN
INTRAVENOUS | Status: DC | PRN
  Administered 2017-08-30: 50 mg via INTRAVENOUS

## 2017-08-30 MED ORDER — CEFAZOLIN SODIUM-DEXTROSE 2-4 GM/100ML-% IV SOLN
2.0000 g | INTRAVENOUS | Status: AC
  Administered 2017-08-30: 2 g via INTRAVENOUS

## 2017-08-30 MED ORDER — FENTANYL CITRATE (PF) 100 MCG/2ML IJ SOLN
INTRAMUSCULAR | Status: DC | PRN
  Administered 2017-08-30: 50 ug via INTRAVENOUS
  Administered 2017-08-30: 100 ug via INTRAVENOUS
  Administered 2017-08-30: 50 ug via INTRAVENOUS
  Administered 2017-08-30 (×2): 100 ug via INTRAVENOUS

## 2017-08-30 MED ORDER — LIDOCAINE HCL (CARDIAC) 20 MG/ML IV SOLN
INTRAVENOUS | Status: AC
  Filled 2017-08-30: qty 5

## 2017-08-30 MED ORDER — SODIUM CHLORIDE 0.9 % IR SOLN
Status: DC | PRN
  Administered 2017-08-30: 3000 mL

## 2017-08-30 SURGICAL SUPPLY — 45 items
BARRIER ADHS 3X4 INTERCEED (GAUZE/BANDAGES/DRESSINGS) ×2 IMPLANT
CABLE HIGH FREQUENCY MONO STRZ (ELECTRODE) IMPLANT
DERMABOND ADVANCED (GAUZE/BANDAGES/DRESSINGS) ×1
DERMABOND ADVANCED .7 DNX12 (GAUZE/BANDAGES/DRESSINGS) ×1 IMPLANT
DEVICE SUTURE ENDOST 10MM (ENDOMECHANICALS) ×2 IMPLANT
DURAPREP 26ML APPLICATOR (WOUND CARE) ×2 IMPLANT
FILTER SMOKE EVAC LAPAROSHD (FILTER) ×2 IMPLANT
GLOVE BIO SURGEON STRL SZ 6.5 (GLOVE) ×4 IMPLANT
GLOVE BIOGEL PI IND STRL 7.0 (GLOVE) ×2 IMPLANT
GLOVE BIOGEL PI INDICATOR 7.0 (GLOVE) ×2
GLOVE ORTHO TXT STRL SZ7.5 (GLOVE) ×2 IMPLANT
GOWN STRL REUS W/TWL LRG LVL3 (GOWN DISPOSABLE) ×6 IMPLANT
NEEDLE INSUFFLATION 120MM (ENDOMECHANICALS) ×2 IMPLANT
NS IRRIG 1000ML POUR BTL (IV SOLUTION) ×2 IMPLANT
OCCLUDER COLPOPNEUMO (BALLOONS) ×2 IMPLANT
PACK LAPAROSCOPY BASIN (CUSTOM PROCEDURE TRAY) ×2 IMPLANT
PACK TRENDGUARD 450 HYBRID PRO (MISCELLANEOUS) IMPLANT
PACK TRENDGUARD 600 HYBRD PROC (MISCELLANEOUS) IMPLANT
PROTECTOR NERVE ULNAR (MISCELLANEOUS) ×4 IMPLANT
SCISSORS LAP 5X35 DISP (ENDOMECHANICALS) IMPLANT
SET CYSTO W/LG BORE CLAMP LF (SET/KITS/TRAYS/PACK) IMPLANT
SET IRRIG TUBING LAPAROSCOPIC (IRRIGATION / IRRIGATOR) ×2 IMPLANT
SHEARS HARMONIC ACE PLUS 36CM (ENDOMECHANICALS) IMPLANT
SLEEVE XCEL OPT CAN 5 100 (ENDOMECHANICALS) ×2 IMPLANT
SUT ENDO VLOC 180-0-8IN (SUTURE) ×2 IMPLANT
SUT VIC AB 0 CT1 27 (SUTURE) ×2
SUT VIC AB 0 CT1 27XBRD ANBCTR (SUTURE) ×2 IMPLANT
SUT VIC AB 3-0 PS2 18 (SUTURE) ×1
SUT VIC AB 3-0 PS2 18XBRD (SUTURE) ×1 IMPLANT
SUT VICRYL 0 UR6 27IN ABS (SUTURE) ×2 IMPLANT
SYR 10ML LL (SYRINGE) ×2 IMPLANT
SYR 50ML LL SCALE MARK (SYRINGE) ×2 IMPLANT
SYSTEM CARTER THOMASON II (TROCAR) ×2 IMPLANT
TIP UTERINE 5.1X6CM LAV DISP (MISCELLANEOUS) IMPLANT
TIP UTERINE 6.7X10CM GRN DISP (MISCELLANEOUS) IMPLANT
TIP UTERINE 6.7X6CM WHT DISP (MISCELLANEOUS) IMPLANT
TIP UTERINE 6.7X8CM BLUE DISP (MISCELLANEOUS) ×2 IMPLANT
TOWEL OR 17X24 6PK STRL BLUE (TOWEL DISPOSABLE) ×6 IMPLANT
TRAY FOLEY CATH SILVER 14FR (SET/KITS/TRAYS/PACK) ×2 IMPLANT
TRENDGUARD 450 HYBRID PRO PACK (MISCELLANEOUS)
TRENDGUARD 600 HYBRID PROC PK (MISCELLANEOUS)
TROCAR BALLN 12MMX100 BLUNT (TROCAR) ×2 IMPLANT
TROCAR XCEL NON-BLD 11X100MML (ENDOMECHANICALS) ×2 IMPLANT
TROCAR XCEL NON-BLD 5MMX100MML (ENDOMECHANICALS) ×2 IMPLANT
WARMER LAPAROSCOPE (MISCELLANEOUS) ×2 IMPLANT

## 2017-08-30 NOTE — Transfer of Care (Signed)
Immediate Anesthesia Transfer of Care Note  Patient: Maria Stein  Procedure(s) Performed: TOTAL LAPAROSCOPIC HYSTERECTOMY WITH SALPINGECTOMY (Bilateral Abdomen)  Patient Location: PACU  Anesthesia Type:General  Level of Consciousness: awake, alert  and oriented  Airway & Oxygen Therapy: Patient Spontanous Breathing and Patient connected to nasal cannula oxygen  Post-op Assessment: Report given to RN and Post -op Vital signs reviewed and stable  Post vital signs: Reviewed and stable  Last Vitals:  Vitals:   08/30/17 0718  BP: (!) 145/97  Pulse: (!) 105  Resp: 16  Temp: 36.7 C  SpO2: 100%    Last Pain:  Vitals:   08/30/17 0718  TempSrc: Oral  PainSc: 2       Patients Stated Pain Goal: 4 (94/70/96 2836)  Complications: No apparent anesthesia complications

## 2017-08-30 NOTE — Discharge Summary (Signed)
Physician Discharge Summary  Patient ID: Maria Stein MRN: [id number] DOB/AGE: [birth year] 42 y.o.  Admit date: 08/30/2017 Discharge date: 08/31/2017  Admission Diagnoses: Menorrhagia Fibroid uterus  Discharge Diagnoses:  Active Problems:   Uterine fibroid   Menorrhagia   S/P laparoscopic hysterectomy   Discharged Condition: good  Hospital Course: Pt underwent TLH/bilateral salpingectomies and was admitted to extended recovery.  By post-operative day #1, she was ambulating, tolerating po intake and her pain was well-controlled on oral medications.  She was voiding without problem.     Significant Diagnostic Studies: labs: CBC, BMP  Treatments: TLH/BS  Discharge Exam: Blood pressure 106/62, pulse 85, temperature 98.3 F (36.8 C), temperature source Oral, resp. rate 16, height 5\' 5"  (1.651 m), weight 161 lb (73 kg), last menstrual period 06/28/2017, SpO2 96 %. General appearance: alert and cooperative GI: soft NT Incision/Wound:C/D/I  Disposition: 01-Home or Self Care  Discharge Instructions    Call MD for:  persistant nausea and vomiting   Complete by:  As directed    Call MD for:  severe uncontrolled pain   Complete by:  As directed    Call MD for:  temperature >100.4   Complete by:  As directed    Diet - low sodium heart healthy   Complete by:  As directed    Discharge instructions   Complete by:  As directed    Avoid driving for at least 1-2 weeks or until off narcotic pain meds.  No heavy lifting greater than 10 lbs.  Nothing in vagina for 6 weeks.  May remove bandage in 1-2 days.  Shower over incision and pat dry.   Increase activity slowly   Complete by:  As directed      Allergies as of 08/31/2017      Reactions   Hydrocodone Nausea And Vomiting      Medication List    STOP taking these medications   medroxyPROGESTERone 150 MG/ML injection Commonly known as:  DEPO-PROVERA   promethazine 12.5 MG tablet Commonly known as:  PHENERGAN      TAKE these medications   acetaminophen 500 MG tablet Commonly known as:  TYLENOL Take 500-1,000 mg by mouth every 6 (six) hours as needed (for pain).   CALCIUM/VITAMIN D3/ADULT GUMMY PO Take 2 tablets by mouth daily.   esomeprazole 20 MG capsule Commonly known as:  NEXIUM Take 40 mg by mouth daily before breakfast.   ibuprofen 600 MG tablet Commonly known as:  ADVIL,MOTRIN Take 1 tablet (600 mg total) by mouth every 6 (six) hours as needed (mild pain). What changed:    medication strength  how much to take  when to take this  reasons to take this   ONE-A-DAY WOMENS VITACRAVES PO Take 2 tablets by mouth daily.   traMADol 50 MG tablet Commonly known as:  ULTRAM Take 1 tablet (50 mg total) by mouth every 6 (six) hours as needed for moderate pain.   VITAMIN D3 GUMMIES ADULT PO Take 2 tablets by mouth daily.      Follow-up Information    Paula Compton, MD. Schedule an appointment as soon as possible for a visit in 2 day(s).   Specialty:  Obstetrics and Gynecology Why:  2 weeks--incision check Contact information: [address] [place] [address] [phone]           Signed: Logan Bores 08/31/2017, 9:11 AM

## 2017-08-30 NOTE — Progress Notes (Signed)
Day of Surgery Procedure(s) (LRB): TOTAL LAPAROSCOPIC HYSTERECTOMY WITH SALPINGECTOMY (Bilateral)  Subjective: Patient reports tolerating PO clears with no nausea.  She is easily responsive but still very sleepy.  She reports took a long time to fully wake from her hand surgery as well.    Pain controlled with toradol  Objective: I have reviewed patient's vital signs and intake and output.  General: alert and cooperative GI: incision: clean, dry and intact Abdomen soft, NT  Assessment: s/p Procedure(s): TOTAL LAPAROSCOPIC HYSTERECTOMY WITH SALPINGECTOMY (Bilateral): stable, just still somewhat sedated  Plan: Advance diet when more alert D/c foley when alert Toradol then ibuprofen for pain.   Prn oxycodone with food.    LOS: 0 days    Logan Bores 08/30/2017, 3:59 PM

## 2017-08-30 NOTE — Progress Notes (Signed)
Patient ID: Maria Stein, female   DOB: 11-14-1975, 42 y.o.   MRN: [id number] Per patient no changes in dictated H&P.  Brief exam WNL.  Pt on Depo provera so no chance of pregnancy.  Ready to proceed.

## 2017-08-30 NOTE — Anesthesia Procedure Notes (Signed)
Procedure Name: Intubation Date/Time: 08/30/2017 8:44 AM Performed by: Jonna Munro, CRNA Pre-anesthesia Checklist: Patient identified, Emergency Drugs available, Suction available, Patient being monitored and Timeout performed Patient Re-evaluated:Patient Re-evaluated prior to induction Oxygen Delivery Method: Circle system utilized Preoxygenation: Pre-oxygenation with 100% oxygen Induction Type: IV induction Ventilation: Mask ventilation without difficulty Laryngoscope Size: Mac and 3 Grade View: Grade I Tube type: Oral Tube size: 7.0 mm Number of attempts: 1 Airway Equipment and Method: Stylet Placement Confirmation: ETT inserted through vocal cords under direct vision,  positive ETCO2 and breath sounds checked- equal and bilateral Secured at: 22 cm Tube secured with: Tape Dental Injury: Teeth and Oropharynx as per pre-operative assessment

## 2017-08-30 NOTE — Op Note (Signed)
Operative Note    Preoperative Diagnosis Menorrhagia Fibroid uterus  Postoperative Diagnosis same  Procedure Total laparoscopic hysterectomy and bilateral salpingectomies  Surgeon Paula Compton, MD Carlynn Purl, MD  Anesthesia GETA  Fluids: EBL 134mL UOP 156mL clear IVF 1874mL LR  Findings Uterine fundus and normal in size but a large 6cm fibroid extended off the posterior left LUS  Tubes and ovaries WNL  Specimen Uterus, tubes and cervix  Procedure Note Patient was taken to the operating room where general anesthesia was obtained without difficulty she was prepped and draped in the normal sterile fashion in the dorsal lithotomy position. An appropriate time out was performed. A Rumi with cervical cup was placed after dilation and stay sutures placed to hold the cup onto the cervix at 3 and 9 o'clock.  The vaginal occluder was filled and a Foley catheter placed in the bladder. Attention was then turned to the abdomen where of infraumbilical incision was made after injection with quarter percent Marcaine approximate 1 cm in width. The verees needle was then introduced and intraperitoneal placement confirmed with instillation of NS.  This was then removed and a 5 mm optiview trocar placed with direct visualization.  Two additional  ports were placed under direct visualization from the lateral upper quadrants. A 45mm port was placed on the right and an 11 mm port on the left.   Each port site was injected with quarter percent Marcaine prior placement.   With patient in Trendelenburg the uterus and tubes and ovaries were inspected with findings as previously stated. The Harmonic scalpel was then utilized to dissect the fallopian tubes from the mesosalpinx bilaterally down to the level of the cornua.  The remainder of the uteroovarian ligament and the round ligament were then also taken down with the Harmonic to the level of the bladder flap.  The bladder flap was taken down from the  lower uterine segment and pushed away to expose the cervix.  The uterine arteries were then taken down bilaterally and the vaginal cuff identified and opened over the vaginal ring.  The cuff was then completely opened over the ring until the uterus was completely free.  The posterior fibroid was lifted and the cuff able to be visualized.  The cul-de-sac was noted to be shallow, but the cuff was open away from the rectum.    It was then pulled down into the vagina.  The vaginal cuff was then closed with a running v-lock suture with good closure.The cuff and pedicles were hemostatic and the ureters visualized and normal in appearance. A four quadrant view of the pelvis and abdomen was performed and found to be normal with no bleeding or injuries noted.  The instruments were removed from the abdomen as well as the lateral ports under visualization.  The pneumoperitoneum was reduced through the trocar. The trocar was finally removed and the infraumbilical incision and lateral incisions were closed.  The left lateral port was closed with a deep suture of 0 vicryl utilizing the [employer] device and with a subcuticular stitch of 3-0 Vicryl. The 54mm port was closed with a subcuticular stitch. Dermabond and a bandage were placed. Patient was then awakened and taken to the recovery room in good condition.

## 2017-08-31 ENCOUNTER — Encounter (HOSPITAL_COMMUNITY): Payer: Self-pay | Admitting: Obstetrics and Gynecology

## 2017-08-31 DIAGNOSIS — N920 Excessive and frequent menstruation with regular cycle: Secondary | ICD-10-CM | POA: Diagnosis not present

## 2017-08-31 DIAGNOSIS — K219 Gastro-esophageal reflux disease without esophagitis: Secondary | ICD-10-CM | POA: Diagnosis not present

## 2017-08-31 DIAGNOSIS — N888 Other specified noninflammatory disorders of cervix uteri: Secondary | ICD-10-CM | POA: Diagnosis not present

## 2017-08-31 DIAGNOSIS — D259 Leiomyoma of uterus, unspecified: Secondary | ICD-10-CM | POA: Diagnosis not present

## 2017-08-31 DIAGNOSIS — Z79899 Other long term (current) drug therapy: Secondary | ICD-10-CM | POA: Diagnosis not present

## 2017-08-31 LAB — CBC
HCT: 29.3 % — ABNORMAL LOW (ref 36.0–46.0)
Hemoglobin: 10.1 g/dL — ABNORMAL LOW (ref 12.0–15.0)
MCH: 30.7 pg (ref 26.0–34.0)
MCHC: 34.5 g/dL (ref 30.0–36.0)
MCV: 89.1 fL (ref 78.0–100.0)
Platelets: 154 10*3/uL (ref 150–400)
RBC: 3.29 MIL/uL — ABNORMAL LOW (ref 3.87–5.11)
RDW: 13 % (ref 11.5–15.5)
WBC: 9 10*3/uL (ref 4.0–10.5)

## 2017-08-31 LAB — BASIC METABOLIC PANEL
Anion gap: 7 (ref 5–15)
BUN: 13 mg/dL (ref 6–20)
CO2: 24 mmol/L (ref 22–32)
Calcium: 8.2 mg/dL — ABNORMAL LOW (ref 8.9–10.3)
Chloride: 104 mmol/L (ref 101–111)
Creatinine, Ser: 1.09 mg/dL — ABNORMAL HIGH (ref 0.44–1.00)
GFR calc Af Amer: >60 mL/min (ref >60)
GFR calc non Af Amer: >60 mL/min (ref >60)
Glucose, Bld: 100 mg/dL — ABNORMAL HIGH (ref 65–99)
Potassium: 4 mmol/L (ref 3.5–5.1)
Sodium: 135 mmol/L (ref 135–145)

## 2017-08-31 MED ORDER — IBUPROFEN 600 MG PO TABS: 600.0000 mg | ORAL_TABLET | Freq: Four times a day (QID) | ORAL | 0 refills | Status: AC | PRN

## 2017-08-31 MED ORDER — TRAMADOL HCL 50 MG PO TABS: 50.0000 mg | ORAL_TABLET | Freq: Four times a day (QID) | ORAL | 1 refills | Status: AC | PRN

## 2017-08-31 MED ORDER — TRAMADOL HCL 50 MG PO TABS: 50.0000 mg | ORAL_TABLET | Freq: Four times a day (QID) | ORAL | Status: DC | PRN

## 2017-08-31 NOTE — Anesthesia Postprocedure Evaluation (Signed)
Anesthesia Post Note  Patient: Maria Stein  Procedure(s) Performed: TOTAL LAPAROSCOPIC HYSTERECTOMY WITH SALPINGECTOMY (Bilateral Abdomen)     Patient location during evaluation: PACU Anesthesia Type: General Level of consciousness: awake and alert Pain management: pain level controlled Vital Signs Assessment: post-procedure vital signs reviewed and stable Respiratory status: spontaneous breathing, nonlabored ventilation and respiratory function stable Cardiovascular status: blood pressure returned to baseline and stable Postop Assessment: no apparent nausea or vomiting Anesthetic complications: no    Last Vitals:  Vitals:   08/31/17 0400 08/31/17 0800  BP: 107/63 106/62  Pulse: 60 85  Resp: 16 16  Temp: 37.1 C 36.8 C  SpO2: 99% 96%    Last Pain:  Vitals:   08/31/17 0830  TempSrc:   PainSc: [place]

## 2017-08-31 NOTE — Progress Notes (Signed)
Discharge instructions given to patient and significant other. Patient and caregiver verbalize understanding.  Change in medications, follow up appointments and post operative care discussed. Paperwork signed.

## 2017-08-31 NOTE — Progress Notes (Signed)
1 Day Post-Op Procedure(s) (LRB): TOTAL LAPAROSCOPIC HYSTERECTOMY WITH SALPINGECTOMY (Bilateral)  Subjective: Patient reports no problems voiding since catheter out.  Ambulated and tolerated regular diet last PM, eating breakfast now.  Pain reasonably controlled with toradol and tramadol.  Oxycodone made her itch  Objective: I have reviewed patient's vital signs, intake and output and labs.  General: alert and cooperative GI: soft NT, incisions c/d/i Vaginal Bleeding: minimal  Assessment: s/p Procedure(s): TOTAL LAPAROSCOPIC HYSTERECTOMY WITH SALPINGECTOMY (Bilateral): progressing well and tolerating diet  Plan: Discharge home as long as tolerates breakfast and po ibuprofen  LOS: 0 days    Logan Bores 08/31/2017, 9:02 AM

## 2017-09-13 DIAGNOSIS — R110 Nausea: Secondary | ICD-10-CM | POA: Diagnosis not present

## 2017-10-10 DIAGNOSIS — Z111 Encounter for screening for respiratory tuberculosis: Secondary | ICD-10-CM | POA: Diagnosis not present

## 2017-11-01 DIAGNOSIS — F064 Anxiety disorder due to known physiological condition: Secondary | ICD-10-CM | POA: Diagnosis not present

## 2017-11-01 DIAGNOSIS — N390 Urinary tract infection, site not specified: Secondary | ICD-10-CM | POA: Diagnosis not present

## 2017-11-01 DIAGNOSIS — I10 Essential (primary) hypertension: Secondary | ICD-10-CM | POA: Diagnosis not present

## 2017-11-15 DIAGNOSIS — I10 Essential (primary) hypertension: Secondary | ICD-10-CM | POA: Diagnosis not present

## 2017-11-21 ENCOUNTER — Inpatient Hospital Stay: Admission: RE | Admit: 2017-11-21 | Payer: BLUE CROSS/BLUE SHIELD | Source: Ambulatory Visit

## 2017-11-28 ENCOUNTER — Ambulatory Visit
Admission: RE | Admit: 2017-11-28 | Discharge: 2017-11-28 | Disposition: A | Discharge status: Home / Self Care | Payer: BLUE CROSS/BLUE SHIELD | Source: Ambulatory Visit | Attending: Gynecology | Admitting: Gynecology

## 2017-11-28 ENCOUNTER — Other Ambulatory Visit: Payer: Self-pay | Admitting: Gynecology

## 2017-11-28 DIAGNOSIS — R928 Other abnormal and inconclusive findings on diagnostic imaging of breast: Secondary | ICD-10-CM | POA: Diagnosis not present

## 2017-11-28 DIAGNOSIS — N6002 Solitary cyst of left breast: Secondary | ICD-10-CM | POA: Diagnosis not present

## 2017-11-28 DIAGNOSIS — N6320 Unspecified lump in the left breast, unspecified quadrant: Secondary | ICD-10-CM

## 2017-12-08 DIAGNOSIS — K219 Gastro-esophageal reflux disease without esophagitis: Secondary | ICD-10-CM | POA: Diagnosis not present

## 2017-12-08 DIAGNOSIS — I10 Essential (primary) hypertension: Secondary | ICD-10-CM | POA: Diagnosis not present

## 2017-12-08 DIAGNOSIS — H8303 Labyrinthitis, bilateral: Secondary | ICD-10-CM | POA: Diagnosis not present

## 2017-12-08 DIAGNOSIS — F064 Anxiety disorder due to known physiological condition: Secondary | ICD-10-CM | POA: Diagnosis not present

## 2018-01-02 DIAGNOSIS — K219 Gastro-esophageal reflux disease without esophagitis: Secondary | ICD-10-CM | POA: Diagnosis not present

## 2018-01-02 DIAGNOSIS — I10 Essential (primary) hypertension: Secondary | ICD-10-CM | POA: Diagnosis not present

## 2018-01-02 DIAGNOSIS — F064 Anxiety disorder due to known physiological condition: Secondary | ICD-10-CM | POA: Diagnosis not present

## 2018-01-17 DIAGNOSIS — L918 Other hypertrophic disorders of the skin: Secondary | ICD-10-CM | POA: Diagnosis not present

## 2018-04-03 DIAGNOSIS — K219 Gastro-esophageal reflux disease without esophagitis: Secondary | ICD-10-CM | POA: Diagnosis not present

## 2018-04-03 DIAGNOSIS — F064 Anxiety disorder due to known physiological condition: Secondary | ICD-10-CM | POA: Diagnosis not present

## 2018-04-03 DIAGNOSIS — I10 Essential (primary) hypertension: Secondary | ICD-10-CM | POA: Diagnosis not present

## 2018-04-03 DIAGNOSIS — Z23 Encounter for immunization: Secondary | ICD-10-CM | POA: Diagnosis not present

## 2018-04-11 DIAGNOSIS — R102 Pelvic and perineal pain: Secondary | ICD-10-CM | POA: Diagnosis not present

## 2018-05-25 DIAGNOSIS — Z130 Encounter for screening for diseases of the blood and blood-forming organs and certain disorders involving the immune mechanism: Secondary | ICD-10-CM | POA: Diagnosis not present

## 2018-05-25 DIAGNOSIS — Z1231 Encounter for screening mammogram for malignant neoplasm of breast: Secondary | ICD-10-CM | POA: Diagnosis not present

## 2018-05-25 DIAGNOSIS — Z01419 Encounter for gynecological examination (general) (routine) without abnormal findings: Secondary | ICD-10-CM | POA: Diagnosis not present

## 2018-05-25 DIAGNOSIS — Z6827 Body mass index (BMI) 27.0-27.9, adult: Secondary | ICD-10-CM | POA: Diagnosis not present

## 2018-05-25 DIAGNOSIS — Z124 Encounter for screening for malignant neoplasm of cervix: Secondary | ICD-10-CM | POA: Diagnosis not present

## 2018-05-31 DIAGNOSIS — I10 Essential (primary) hypertension: Secondary | ICD-10-CM | POA: Diagnosis not present

## 2018-05-31 DIAGNOSIS — M941 Relapsing polychondritis: Secondary | ICD-10-CM | POA: Diagnosis not present

## 2018-05-31 DIAGNOSIS — R1030 Lower abdominal pain, unspecified: Secondary | ICD-10-CM | POA: Diagnosis not present

## 2018-06-01 DIAGNOSIS — R1030 Lower abdominal pain, unspecified: Secondary | ICD-10-CM | POA: Diagnosis not present

## 2018-06-01 DIAGNOSIS — K589 Irritable bowel syndrome without diarrhea: Secondary | ICD-10-CM | POA: Diagnosis not present

## 2018-06-28 DIAGNOSIS — N898 Other specified noninflammatory disorders of vagina: Secondary | ICD-10-CM | POA: Diagnosis not present

## 2018-07-03 DIAGNOSIS — F3289 Other specified depressive episodes: Secondary | ICD-10-CM | POA: Diagnosis not present

## 2018-07-03 DIAGNOSIS — Z6826 Body mass index (BMI) 26.0-26.9, adult: Secondary | ICD-10-CM | POA: Diagnosis not present

## 2018-07-03 DIAGNOSIS — F064 Anxiety disorder due to known physiological condition: Secondary | ICD-10-CM | POA: Diagnosis not present

## 2018-07-03 DIAGNOSIS — I10 Essential (primary) hypertension: Secondary | ICD-10-CM | POA: Diagnosis not present

## 2018-07-05 DIAGNOSIS — R42 Dizziness and giddiness: Secondary | ICD-10-CM | POA: Diagnosis not present

## 2018-07-05 DIAGNOSIS — E860 Dehydration: Secondary | ICD-10-CM | POA: Diagnosis not present

## 2018-07-05 DIAGNOSIS — T50905A Adverse effect of unspecified drugs, medicaments and biological substances, initial encounter: Secondary | ICD-10-CM | POA: Diagnosis not present

## 2018-07-05 DIAGNOSIS — T502X5A Adverse effect of carbonic-anhydrase inhibitors, benzothiadiazides and other diuretics, initial encounter: Secondary | ICD-10-CM | POA: Diagnosis not present

## 2018-07-05 DIAGNOSIS — R5381 Other malaise: Secondary | ICD-10-CM | POA: Diagnosis not present

## 2018-07-05 DIAGNOSIS — K219 Gastro-esophageal reflux disease without esophagitis: Secondary | ICD-10-CM | POA: Diagnosis not present

## 2018-07-05 DIAGNOSIS — R110 Nausea: Secondary | ICD-10-CM | POA: Diagnosis not present

## 2018-07-06 DIAGNOSIS — Z79899 Other long term (current) drug therapy: Secondary | ICD-10-CM | POA: Diagnosis not present

## 2018-07-06 DIAGNOSIS — B349 Viral infection, unspecified: Secondary | ICD-10-CM | POA: Diagnosis not present

## 2018-07-06 DIAGNOSIS — R202 Paresthesia of skin: Secondary | ICD-10-CM | POA: Diagnosis not present

## 2018-07-06 DIAGNOSIS — K219 Gastro-esophageal reflux disease without esophagitis: Secondary | ICD-10-CM | POA: Diagnosis not present

## 2018-07-06 DIAGNOSIS — R064 Hyperventilation: Secondary | ICD-10-CM | POA: Diagnosis not present

## 2018-07-06 DIAGNOSIS — F419 Anxiety disorder, unspecified: Secondary | ICD-10-CM | POA: Diagnosis not present

## 2018-07-16 DIAGNOSIS — R1030 Lower abdominal pain, unspecified: Secondary | ICD-10-CM | POA: Diagnosis not present

## 2018-07-16 DIAGNOSIS — F064 Anxiety disorder due to known physiological condition: Secondary | ICD-10-CM | POA: Diagnosis not present

## 2018-07-16 DIAGNOSIS — Z6825 Body mass index (BMI) 25.0-25.9, adult: Secondary | ICD-10-CM | POA: Diagnosis not present

## 2018-07-16 DIAGNOSIS — K219 Gastro-esophageal reflux disease without esophagitis: Secondary | ICD-10-CM | POA: Diagnosis not present

## 2018-08-07 DIAGNOSIS — F064 Anxiety disorder due to known physiological condition: Secondary | ICD-10-CM | POA: Diagnosis not present

## 2018-08-07 DIAGNOSIS — Z6826 Body mass index (BMI) 26.0-26.9, adult: Secondary | ICD-10-CM | POA: Diagnosis not present

## 2018-08-07 DIAGNOSIS — I10 Essential (primary) hypertension: Secondary | ICD-10-CM | POA: Diagnosis not present

## 2018-08-10 NOTE — Progress Notes (Signed)
Office Visit Note  Patient: Maria Stein             Date of Birth: 04-02-1976           MRN: [id number]             PCP: Lucianne Lei, MD Referring: Lucianne Lei, MD Visit Date: 08/23/2018 Occupation: CNA, home health  Subjective:  Pain in multiple joints.   History of Present Illness: Maria Stein is a 43 y.o. female in consultation per request of her PCP.  According to patient her symptoms are started after her hysterectomy in February 2019.  She states she was having a lot of chest pain and reflux symptoms.  She was evaluated by a gastroenterologist and had EGD.  She was placed on Nexium.  She states she still has ongoing reflux symptoms despite taking Nexium.  She is even had 2 ER visits due to reflux.  She has been having some chest pain and she is uncertain if it is related to the reflux.  She also complains of upper and lower back pain which lasts for few minutes and then goes away.  She is also noticed some discomfort in her wrist joints.  None of the other joints are painful.  She denies any history of joint swelling.  Activities of Daily Living:  Patient reports morning stiffness for 0 minutes.   Patient Denies nocturnal pain.  Difficulty dressing/grooming: Denies Difficulty climbing stairs: Denies Difficulty getting out of chair: Denies Difficulty using hands for taps, buttons, cutlery, and/or writing: Denies  Review of Systems  Constitutional: Positive for fatigue. Negative for night sweats, weight gain and weight loss.  HENT: Negative for mouth sores, trouble swallowing, trouble swallowing, mouth dryness and nose dryness.   Eyes: Negative for pain, redness, itching, visual disturbance and dryness.  Respiratory: Negative for cough, shortness of breath, wheezing and difficulty breathing.   Cardiovascular: Negative for chest pain, palpitations, hypertension, irregular heartbeat and swelling in legs/feet.  Gastrointestinal: Positive for heartburn.  Negative for abdominal pain, blood in stool, constipation, diarrhea, nausea and vomiting.  Endocrine: Negative for increased urination.  Genitourinary: Negative for painful urination, nocturia, pelvic pain and vaginal dryness.  Musculoskeletal: Positive for arthralgias and joint pain. Negative for joint swelling, myalgias, muscle weakness, morning stiffness, muscle tenderness and myalgias.  Skin: Negative for color change, rash, hair loss, redness, skin tightness, ulcers and sensitivity to sunlight.  Allergic/Immunologic: Negative for susceptible to infections.  Neurological: Positive for weakness. Negative for dizziness, headaches, memory loss and night sweats.  Hematological: Negative for bruising/bleeding tendency and swollen glands.  Psychiatric/Behavioral: Negative for depressed mood, confusion and sleep disturbance. The patient is not nervous/anxious.     PMFS History:  Patient Active Problem List   Diagnosis Date Noted  . History of gastroesophageal reflux (GERD) 08/23/2018  . History of anxiety 08/23/2018  . Uterine fibroid 08/30/2017  . Menorrhagia 08/30/2017  . S/P laparoscopic hysterectomy 08/30/2017    Past Medical History:  Diagnosis Date  . Abdominal pain   . Anxiety    no meds  . Fibroids   . GERD (gastroesophageal reflux disease)   . Headache    otc med prn  . History of miscarriage   . Seasonal allergies     Family History  Problem Relation Age of Onset  . Heart disease Father   . Hypertension Father   . Diabetes Father   . Hypertension Mother   . Sleep apnea Brother   . Diabetes Brother    Past  Surgical History:  Procedure Laterality Date  . DILATION AND CURETTAGE OF UTERUS  2015  . HAND SURGERY Right    plate/screws in right hand  . TOTAL LAPAROSCOPIC HYSTERECTOMY WITH SALPINGECTOMY Bilateral 08/30/2017   Procedure: TOTAL LAPAROSCOPIC HYSTERECTOMY WITH SALPINGECTOMY;  Surgeon: Paula Compton, MD;  Location: [facility];  Service: Gynecology;  Laterality:  Bilateral;  . UPPER GI ENDOSCOPY    . WISDOM TOOTH EXTRACTION     Social History   Social History Narrative  . Not on file    There is no immunization history on file for this patient.   Objective: Vital Signs: BP 122/80 (BP Location: Right Arm, Patient Position: Sitting, Cuff Size: Normal)   Pulse 80   Resp 12   Ht 5' 5.5" (1.664 m)   Wt 158 lb (71.7 kg)   LMP 06/28/2017   BMI 25.89 kg/m    Physical Exam Vitals signs and nursing note reviewed.  Constitutional:      Appearance: She is well-developed.  HENT:     Head: Normocephalic and atraumatic.  Eyes:     Conjunctiva/sclera: Conjunctivae normal.  Neck:     Musculoskeletal: Normal range of motion.  Cardiovascular:     Rate and Rhythm: Normal rate and regular rhythm.     Heart sounds: Normal heart sounds.  Pulmonary:     Effort: Pulmonary effort is normal.     Breath sounds: Normal breath sounds.  Abdominal:     General: Bowel sounds are normal.     Palpations: Abdomen is soft.  Lymphadenopathy:     Cervical: No cervical adenopathy.  Skin:    General: Skin is warm and dry.     Capillary Refill: Capillary refill takes less than 2 seconds.  Neurological:     Mental Status: She is alert and oriented to person, place, and time.  Psychiatric:        Behavior: Behavior normal.      Musculoskeletal Exam: C-spine thoracic lumbar spine good range of motion without any point tendernes.  Shoulder joint, elbow joints, wrist joints, MCPs PIPs and DIPs with good range of motion with no synovitis.  Hip joints, knee joints, ankles MTPs PIPs with good range of motion with no synovitis.  CDAI Exam: CDAI Score: Not documented Patient Global Assessment: Not documented; Provider Global Assessment: Not documented Swollen: Not documented; Tender: Not documented Joint Exam   Not documented   There is currently no information documented on the homunculus. Go to the Rheumatology activity and complete the homunculus joint  exam.  Investigation: Findings:  05/31/18: sed rate 2, lipid panel WNL, CBC WNL, CMP WNL, ANA 1:320 Nuclear, dense fine speckled   Imaging: No results found.  Recent Labs: Lab Results  Component Value Date   WBC 9.0 08/31/2017   HGB 10.1 (L) 08/31/2017   PLT 154 08/31/2017   NA 135 08/31/2017   K 4.0 08/31/2017   CL 104 08/31/2017   CO2 24 08/31/2017   GLUCOSE 100 (H) 08/31/2017   BUN 13 08/31/2017   CREATININE 1.09 (H) 08/31/2017   BILITOT 1.0 06/25/2016   ALKPHOS 69 06/25/2016   AST 18 06/25/2016   ALT 17 06/25/2016   PROT 8.1 06/25/2016   ALBUMIN 4.6 06/25/2016   CALCIUM 8.2 (L) 08/31/2017   GFRAA >60 08/31/2017    Speciality Comments: No specialty comments available.  Procedures:  No procedures performed Allergies: Hydrocodone   Assessment / Plan:     Visit Diagnoses: Positive ANA (antinuclear antibody) - 05/31/18: sed rate 2,  lipid panel WNL, CBC WNL, CMP WNL, ANA 1:320 Nuclear, dense fine speckled.  Patient does not have any other clinical features of autoimmune disease.  I will obtain AVISE labs today.  Pain in both wrists-no warmth swelling or effusion or synovitis was noted.  Chronic midline low back pain without sciatica-patient gives history of intermittent discomfort for short period of time.  She also has some thoracic pain.  Have given her some back exercises.  If she has persistent symptoms she should notify us.  S/P laparoscopic hysterectomy  History of miscarriage - X1 in first trimester  History of anxiety  History of gastroesophageal reflux (GERD) -she continues to have ongoing reflux symptoms.  She also has abdominal discomfort.  She has intermittent nausea with reflux and bloating.  Have advised her to use probiotics and follow-up with her gastroenterologist.  Orders: No orders of the defined types were placed in this encounter.  No orders of the defined types were placed in this encounter.  .  Follow-Up Instructions: Return for +ANA,  GER.   Bo Merino, MD  Note - This record has been created using Editor, commissioning.  Chart creation errors have been sought, but may not always  have been located. Such creation errors do not reflect on  the standard of medical care.

## 2018-08-23 ENCOUNTER — Telehealth: Payer: Self-pay

## 2018-08-23 ENCOUNTER — Encounter: Payer: Self-pay | Admitting: Rheumatology

## 2018-08-23 ENCOUNTER — Ambulatory Visit: Payer: BLUE CROSS/BLUE SHIELD | Admitting: Rheumatology

## 2018-08-23 VITALS — BP 122/80 | HR 80 | Resp 12 | Ht 65.5 in | Wt 158.0 lb

## 2018-08-23 DIAGNOSIS — G8929 Other chronic pain: Secondary | ICD-10-CM

## 2018-08-23 DIAGNOSIS — R768 Other specified abnormal immunological findings in serum: Secondary | ICD-10-CM | POA: Diagnosis not present

## 2018-08-23 DIAGNOSIS — Z8719 Personal history of other diseases of the digestive system: Secondary | ICD-10-CM | POA: Insufficient documentation

## 2018-08-23 DIAGNOSIS — M545 Low back pain, unspecified: Secondary | ICD-10-CM

## 2018-08-23 DIAGNOSIS — Z90710 Acquired absence of both cervix and uterus: Secondary | ICD-10-CM | POA: Diagnosis not present

## 2018-08-23 DIAGNOSIS — M25531 Pain in right wrist: Secondary | ICD-10-CM | POA: Diagnosis not present

## 2018-08-23 DIAGNOSIS — M25532 Pain in left wrist: Secondary | ICD-10-CM

## 2018-08-23 DIAGNOSIS — Z8759 Personal history of other complications of pregnancy, childbirth and the puerperium: Secondary | ICD-10-CM

## 2018-08-23 DIAGNOSIS — Z8659 Personal history of other mental and behavioral disorders: Secondary | ICD-10-CM

## 2018-08-23 NOTE — Telephone Encounter (Signed)
We ordered AVISE labs today on patient. If labs are negative, we can call patient with results per Dr. Estanislado Pandy and patient does not need a follow up.

## 2018-08-23 NOTE — Telephone Encounter (Signed)
Noted  

## 2018-08-23 NOTE — Patient Instructions (Signed)

## 2018-08-29 ENCOUNTER — Telehealth: Payer: Self-pay | Admitting: Rheumatology

## 2018-08-29 NOTE — Telephone Encounter (Signed)
Attempted to contact the patient and left message for patient to call the office.   AVISE is negative except ANA titer 1:160 Non specific

## 2018-08-29 NOTE — Telephone Encounter (Signed)
Patient advised AVISE is negative except ANA titer 1:160 Non specific. Patient advised she may cancel her new patient follow up if she would like and call if her symptoms worsen or new symptoms develop. Patient cancelled her appointment. Will call if she needs a new appointment.

## 2018-08-29 NOTE — Telephone Encounter (Signed)
Patient called stating she was returning your call.   °

## 2018-09-03 ENCOUNTER — Other Ambulatory Visit: Payer: Self-pay | Admitting: Physician Assistant

## 2018-09-03 DIAGNOSIS — R1010 Upper abdominal pain, unspecified: Secondary | ICD-10-CM

## 2018-09-03 DIAGNOSIS — R110 Nausea: Secondary | ICD-10-CM

## 2018-09-03 DIAGNOSIS — K219 Gastro-esophageal reflux disease without esophagitis: Secondary | ICD-10-CM | POA: Diagnosis not present

## 2018-09-03 DIAGNOSIS — R634 Abnormal weight loss: Secondary | ICD-10-CM | POA: Diagnosis not present

## 2018-09-04 ENCOUNTER — Ambulatory Visit
Admission: RE | Admit: 2018-09-04 | Discharge: 2018-09-04 | Disposition: A | Discharge status: Home / Self Care | Payer: BLUE CROSS/BLUE SHIELD | Source: Ambulatory Visit | Attending: Physician Assistant | Admitting: Physician Assistant

## 2018-09-04 DIAGNOSIS — R1010 Upper abdominal pain, unspecified: Secondary | ICD-10-CM

## 2018-09-04 DIAGNOSIS — K7689 Other specified diseases of liver: Secondary | ICD-10-CM | POA: Diagnosis not present

## 2018-09-04 DIAGNOSIS — R110 Nausea: Secondary | ICD-10-CM

## 2018-09-06 ENCOUNTER — Other Ambulatory Visit: Payer: BLUE CROSS/BLUE SHIELD

## 2018-09-13 ENCOUNTER — Ambulatory Visit: Payer: Self-pay | Admitting: Rheumatology

## 2018-10-02 DIAGNOSIS — R1084 Generalized abdominal pain: Secondary | ICD-10-CM | POA: Diagnosis not present

## 2018-10-02 DIAGNOSIS — R110 Nausea: Secondary | ICD-10-CM | POA: Diagnosis not present

## 2018-10-02 DIAGNOSIS — R1013 Epigastric pain: Secondary | ICD-10-CM | POA: Diagnosis not present

## 2018-10-17 DIAGNOSIS — R1010 Upper abdominal pain, unspecified: Secondary | ICD-10-CM | POA: Diagnosis not present

## 2018-10-17 DIAGNOSIS — R1030 Lower abdominal pain, unspecified: Secondary | ICD-10-CM | POA: Diagnosis not present

## 2018-10-17 DIAGNOSIS — K219 Gastro-esophageal reflux disease without esophagitis: Secondary | ICD-10-CM | POA: Diagnosis not present

## 2018-10-17 DIAGNOSIS — I10 Essential (primary) hypertension: Secondary | ICD-10-CM | POA: Diagnosis not present

## 2019-01-21 DIAGNOSIS — K219 Gastro-esophageal reflux disease without esophagitis: Secondary | ICD-10-CM | POA: Diagnosis not present

## 2019-01-21 DIAGNOSIS — I10 Essential (primary) hypertension: Secondary | ICD-10-CM | POA: Diagnosis not present

## 2019-01-21 DIAGNOSIS — F064 Anxiety disorder due to known physiological condition: Secondary | ICD-10-CM | POA: Diagnosis not present

## 2019-02-20 DIAGNOSIS — R1084 Generalized abdominal pain: Secondary | ICD-10-CM | POA: Diagnosis not present

## 2019-02-20 DIAGNOSIS — R110 Nausea: Secondary | ICD-10-CM | POA: Diagnosis not present

## 2019-02-20 DIAGNOSIS — R634 Abnormal weight loss: Secondary | ICD-10-CM | POA: Diagnosis not present

## 2019-02-25 DIAGNOSIS — K219 Gastro-esophageal reflux disease without esophagitis: Secondary | ICD-10-CM | POA: Diagnosis not present

## 2019-02-25 DIAGNOSIS — F064 Anxiety disorder due to known physiological condition: Secondary | ICD-10-CM | POA: Diagnosis not present

## 2019-02-25 DIAGNOSIS — Z1321 Encounter for screening for nutritional disorder: Secondary | ICD-10-CM | POA: Diagnosis not present

## 2019-02-25 DIAGNOSIS — I10 Essential (primary) hypertension: Secondary | ICD-10-CM | POA: Diagnosis not present

## 2019-02-25 DIAGNOSIS — R634 Abnormal weight loss: Secondary | ICD-10-CM | POA: Diagnosis not present

## 2019-03-13 DIAGNOSIS — Z1389 Encounter for screening for other disorder: Secondary | ICD-10-CM | POA: Diagnosis not present

## 2019-03-13 DIAGNOSIS — Z130 Encounter for screening for diseases of the blood and blood-forming organs and certain disorders involving the immune mechanism: Secondary | ICD-10-CM | POA: Diagnosis not present

## 2019-03-13 DIAGNOSIS — Z1231 Encounter for screening mammogram for malignant neoplasm of breast: Secondary | ICD-10-CM | POA: Diagnosis not present

## 2019-03-13 DIAGNOSIS — Z01419 Encounter for gynecological examination (general) (routine) without abnormal findings: Secondary | ICD-10-CM | POA: Diagnosis not present

## 2019-03-13 DIAGNOSIS — Z6822 Body mass index (BMI) 22.0-22.9, adult: Secondary | ICD-10-CM | POA: Diagnosis not present

## 2019-03-19 ENCOUNTER — Other Ambulatory Visit: Payer: Self-pay | Admitting: Obstetrics and Gynecology

## 2019-03-19 DIAGNOSIS — R928 Other abnormal and inconclusive findings on diagnostic imaging of breast: Secondary | ICD-10-CM

## 2019-03-20 ENCOUNTER — Other Ambulatory Visit: Payer: BLUE CROSS/BLUE SHIELD

## 2019-03-26 ENCOUNTER — Other Ambulatory Visit: Payer: BLUE CROSS/BLUE SHIELD

## 2019-03-28 ENCOUNTER — Ambulatory Visit: Payer: BLUE CROSS/BLUE SHIELD

## 2019-03-28 ENCOUNTER — Ambulatory Visit
Admission: RE | Admit: 2019-03-28 | Discharge: 2019-03-28 | Disposition: A | Discharge status: Home / Self Care | Payer: BLUE CROSS/BLUE SHIELD | Source: Ambulatory Visit | Attending: Obstetrics and Gynecology | Admitting: Obstetrics and Gynecology

## 2019-03-28 ENCOUNTER — Other Ambulatory Visit: Payer: Self-pay

## 2019-03-28 ENCOUNTER — Ambulatory Visit
Admission: RE | Admit: 2019-03-28 | Discharge: 2019-03-28 | Disposition: A | Discharge status: Home / Self Care | Payer: BC Managed Care – PPO | Source: Ambulatory Visit | Attending: Obstetrics and Gynecology | Admitting: Obstetrics and Gynecology

## 2019-03-28 DIAGNOSIS — R928 Other abnormal and inconclusive findings on diagnostic imaging of breast: Secondary | ICD-10-CM

## 2019-03-28 DIAGNOSIS — N6001 Solitary cyst of right breast: Secondary | ICD-10-CM | POA: Diagnosis not present

## 2019-04-15 DIAGNOSIS — Z1159 Encounter for screening for other viral diseases: Secondary | ICD-10-CM | POA: Diagnosis not present

## 2019-04-18 DIAGNOSIS — R1084 Generalized abdominal pain: Secondary | ICD-10-CM | POA: Diagnosis not present

## 2019-04-18 DIAGNOSIS — R634 Abnormal weight loss: Secondary | ICD-10-CM | POA: Diagnosis not present

## 2019-04-18 DIAGNOSIS — K640 First degree hemorrhoids: Secondary | ICD-10-CM | POA: Diagnosis not present

## 2019-04-30 DIAGNOSIS — I10 Essential (primary) hypertension: Secondary | ICD-10-CM | POA: Diagnosis not present

## 2019-04-30 DIAGNOSIS — Z23 Encounter for immunization: Secondary | ICD-10-CM | POA: Diagnosis not present

## 2019-04-30 DIAGNOSIS — F064 Anxiety disorder due to known physiological condition: Secondary | ICD-10-CM | POA: Diagnosis not present

## 2019-04-30 DIAGNOSIS — K219 Gastro-esophageal reflux disease without esophagitis: Secondary | ICD-10-CM | POA: Diagnosis not present

## 2019-08-03 DIAGNOSIS — R079 Chest pain, unspecified: Secondary | ICD-10-CM | POA: Diagnosis not present

## 2019-08-03 DIAGNOSIS — Z885 Allergy status to narcotic agent status: Secondary | ICD-10-CM | POA: Diagnosis not present

## 2019-08-03 DIAGNOSIS — K219 Gastro-esophageal reflux disease without esophagitis: Secondary | ICD-10-CM | POA: Diagnosis not present

## 2019-08-03 DIAGNOSIS — Z888 Allergy status to other drugs, medicaments and biological substances status: Secondary | ICD-10-CM | POA: Diagnosis not present

## 2019-08-03 DIAGNOSIS — M549 Dorsalgia, unspecified: Secondary | ICD-10-CM | POA: Diagnosis not present

## 2019-08-03 DIAGNOSIS — Z79899 Other long term (current) drug therapy: Secondary | ICD-10-CM | POA: Diagnosis not present

## 2019-08-03 DIAGNOSIS — R0789 Other chest pain: Secondary | ICD-10-CM | POA: Diagnosis not present

## 2019-08-03 DIAGNOSIS — M546 Pain in thoracic spine: Secondary | ICD-10-CM | POA: Diagnosis not present

## 2019-08-03 DIAGNOSIS — Z91013 Allergy to seafood: Secondary | ICD-10-CM | POA: Diagnosis not present

## 2019-08-20 DIAGNOSIS — G8929 Other chronic pain: Secondary | ICD-10-CM | POA: Diagnosis not present

## 2019-08-20 DIAGNOSIS — I10 Essential (primary) hypertension: Secondary | ICD-10-CM | POA: Diagnosis not present

## 2019-08-20 DIAGNOSIS — R109 Unspecified abdominal pain: Secondary | ICD-10-CM | POA: Diagnosis not present

## 2019-08-20 DIAGNOSIS — M549 Dorsalgia, unspecified: Secondary | ICD-10-CM | POA: Diagnosis not present

## 2019-08-20 DIAGNOSIS — R358 Other polyuria: Secondary | ICD-10-CM | POA: Diagnosis not present

## 2019-08-20 DIAGNOSIS — D72819 Decreased white blood cell count, unspecified: Secondary | ICD-10-CM | POA: Diagnosis not present

## 2019-08-20 DIAGNOSIS — M542 Cervicalgia: Secondary | ICD-10-CM | POA: Diagnosis not present

## 2019-08-20 DIAGNOSIS — K5900 Constipation, unspecified: Secondary | ICD-10-CM | POA: Diagnosis not present

## 2019-09-10 DIAGNOSIS — K5900 Constipation, unspecified: Secondary | ICD-10-CM | POA: Diagnosis not present

## 2019-09-10 DIAGNOSIS — K219 Gastro-esophageal reflux disease without esophagitis: Secondary | ICD-10-CM | POA: Diagnosis not present

## 2019-09-16 DIAGNOSIS — M62838 Other muscle spasm: Secondary | ICD-10-CM | POA: Diagnosis not present

## 2019-09-16 DIAGNOSIS — M542 Cervicalgia: Secondary | ICD-10-CM | POA: Diagnosis not present

## 2019-09-16 DIAGNOSIS — R293 Abnormal posture: Secondary | ICD-10-CM | POA: Diagnosis not present

## 2019-09-16 DIAGNOSIS — M549 Dorsalgia, unspecified: Secondary | ICD-10-CM | POA: Diagnosis not present

## 2019-09-17 DIAGNOSIS — R1310 Dysphagia, unspecified: Secondary | ICD-10-CM | POA: Diagnosis not present

## 2019-09-17 DIAGNOSIS — K219 Gastro-esophageal reflux disease without esophagitis: Secondary | ICD-10-CM | POA: Diagnosis not present

## 2019-09-17 DIAGNOSIS — I10 Essential (primary) hypertension: Secondary | ICD-10-CM | POA: Diagnosis not present

## 2019-09-17 DIAGNOSIS — R0789 Other chest pain: Secondary | ICD-10-CM | POA: Diagnosis not present

## 2019-10-07 DIAGNOSIS — R293 Abnormal posture: Secondary | ICD-10-CM | POA: Diagnosis not present

## 2019-10-07 DIAGNOSIS — M542 Cervicalgia: Secondary | ICD-10-CM | POA: Diagnosis not present

## 2019-10-07 DIAGNOSIS — M549 Dorsalgia, unspecified: Secondary | ICD-10-CM | POA: Diagnosis not present

## 2019-10-07 DIAGNOSIS — M62838 Other muscle spasm: Secondary | ICD-10-CM | POA: Diagnosis not present

## 2019-10-18 DIAGNOSIS — R1013 Epigastric pain: Secondary | ICD-10-CM | POA: Diagnosis not present

## 2019-10-18 DIAGNOSIS — R350 Frequency of micturition: Secondary | ICD-10-CM | POA: Diagnosis not present

## 2019-10-31 DIAGNOSIS — M549 Dorsalgia, unspecified: Secondary | ICD-10-CM | POA: Diagnosis not present

## 2019-10-31 DIAGNOSIS — F419 Anxiety disorder, unspecified: Secondary | ICD-10-CM | POA: Diagnosis not present

## 2019-10-31 DIAGNOSIS — K219 Gastro-esophageal reflux disease without esophagitis: Secondary | ICD-10-CM | POA: Diagnosis not present

## 2019-10-31 DIAGNOSIS — M546 Pain in thoracic spine: Secondary | ICD-10-CM | POA: Diagnosis not present

## 2019-10-31 DIAGNOSIS — I10 Essential (primary) hypertension: Secondary | ICD-10-CM | POA: Diagnosis not present

## 2019-11-04 DIAGNOSIS — R102 Pelvic and perineal pain: Secondary | ICD-10-CM | POA: Diagnosis not present

## 2019-11-04 DIAGNOSIS — N898 Other specified noninflammatory disorders of vagina: Secondary | ICD-10-CM | POA: Diagnosis not present

## 2019-11-08 DIAGNOSIS — K219 Gastro-esophageal reflux disease without esophagitis: Secondary | ICD-10-CM | POA: Diagnosis not present

## 2019-11-08 DIAGNOSIS — K5900 Constipation, unspecified: Secondary | ICD-10-CM | POA: Diagnosis not present

## 2019-11-08 DIAGNOSIS — R110 Nausea: Secondary | ICD-10-CM | POA: Diagnosis not present

## 2019-11-08 DIAGNOSIS — R109 Unspecified abdominal pain: Secondary | ICD-10-CM | POA: Diagnosis not present

## 2019-11-18 DIAGNOSIS — R293 Abnormal posture: Secondary | ICD-10-CM | POA: Diagnosis not present

## 2019-11-18 DIAGNOSIS — M62838 Other muscle spasm: Secondary | ICD-10-CM | POA: Diagnosis not present

## 2019-11-18 DIAGNOSIS — M549 Dorsalgia, unspecified: Secondary | ICD-10-CM | POA: Diagnosis not present

## 2019-11-18 DIAGNOSIS — M542 Cervicalgia: Secondary | ICD-10-CM | POA: Diagnosis not present

## 2019-11-29 DIAGNOSIS — K2930 Chronic superficial gastritis without bleeding: Secondary | ICD-10-CM | POA: Diagnosis not present

## 2019-11-29 DIAGNOSIS — R10816 Epigastric abdominal tenderness: Secondary | ICD-10-CM | POA: Diagnosis not present

## 2019-11-29 DIAGNOSIS — K5900 Constipation, unspecified: Secondary | ICD-10-CM | POA: Diagnosis not present

## 2019-11-29 DIAGNOSIS — K219 Gastro-esophageal reflux disease without esophagitis: Secondary | ICD-10-CM | POA: Diagnosis not present

## 2020-02-07 DIAGNOSIS — G8929 Other chronic pain: Secondary | ICD-10-CM | POA: Diagnosis not present

## 2020-02-07 DIAGNOSIS — I10 Essential (primary) hypertension: Secondary | ICD-10-CM | POA: Diagnosis not present

## 2020-02-07 DIAGNOSIS — Z0000 Encounter for general adult medical examination without abnormal findings: Secondary | ICD-10-CM | POA: Diagnosis not present

## 2020-02-07 DIAGNOSIS — K219 Gastro-esophageal reflux disease without esophagitis: Secondary | ICD-10-CM | POA: Diagnosis not present

## 2020-02-07 DIAGNOSIS — F419 Anxiety disorder, unspecified: Secondary | ICD-10-CM | POA: Diagnosis not present

## 2020-02-07 DIAGNOSIS — M549 Dorsalgia, unspecified: Secondary | ICD-10-CM | POA: Diagnosis not present

## 2020-04-03 DIAGNOSIS — M549 Dorsalgia, unspecified: Secondary | ICD-10-CM | POA: Diagnosis not present

## 2020-04-03 DIAGNOSIS — G8929 Other chronic pain: Secondary | ICD-10-CM | POA: Diagnosis not present

## 2020-04-08 DIAGNOSIS — M47812 Spondylosis without myelopathy or radiculopathy, cervical region: Secondary | ICD-10-CM | POA: Diagnosis not present

## 2020-04-08 DIAGNOSIS — M5124 Other intervertebral disc displacement, thoracic region: Secondary | ICD-10-CM | POA: Diagnosis not present

## 2020-04-08 DIAGNOSIS — M4802 Spinal stenosis, cervical region: Secondary | ICD-10-CM | POA: Diagnosis not present

## 2020-04-08 DIAGNOSIS — M542 Cervicalgia: Secondary | ICD-10-CM | POA: Diagnosis not present

## 2020-04-29 DIAGNOSIS — Z7185 Encounter for immunization safety counseling: Secondary | ICD-10-CM | POA: Diagnosis not present

## 2020-04-29 DIAGNOSIS — M549 Dorsalgia, unspecified: Secondary | ICD-10-CM | POA: Diagnosis not present

## 2020-04-29 DIAGNOSIS — G8929 Other chronic pain: Secondary | ICD-10-CM | POA: Diagnosis not present

## 2020-04-29 DIAGNOSIS — J90 Pleural effusion, not elsewhere classified: Secondary | ICD-10-CM | POA: Diagnosis not present

## 2020-05-08 DIAGNOSIS — M5414 Radiculopathy, thoracic region: Secondary | ICD-10-CM | POA: Diagnosis not present

## 2020-05-13 DIAGNOSIS — Z130 Encounter for screening for diseases of the blood and blood-forming organs and certain disorders involving the immune mechanism: Secondary | ICD-10-CM | POA: Diagnosis not present

## 2020-05-13 DIAGNOSIS — Z1389 Encounter for screening for other disorder: Secondary | ICD-10-CM | POA: Diagnosis not present

## 2020-05-13 DIAGNOSIS — Z01419 Encounter for gynecological examination (general) (routine) without abnormal findings: Secondary | ICD-10-CM | POA: Diagnosis not present

## 2020-05-13 DIAGNOSIS — Z1231 Encounter for screening mammogram for malignant neoplasm of breast: Secondary | ICD-10-CM | POA: Diagnosis not present

## 2020-05-13 DIAGNOSIS — Z6824 Body mass index (BMI) 24.0-24.9, adult: Secondary | ICD-10-CM | POA: Diagnosis not present

## 2020-05-21 ENCOUNTER — Other Ambulatory Visit: Payer: Self-pay | Admitting: Obstetrics and Gynecology

## 2020-05-21 DIAGNOSIS — N6310 Unspecified lump in the right breast, unspecified quadrant: Secondary | ICD-10-CM

## 2020-05-21 DIAGNOSIS — Z1231 Encounter for screening mammogram for malignant neoplasm of breast: Secondary | ICD-10-CM

## 2020-05-21 DIAGNOSIS — N6320 Unspecified lump in the left breast, unspecified quadrant: Secondary | ICD-10-CM

## 2020-05-22 DIAGNOSIS — L700 Acne vulgaris: Secondary | ICD-10-CM | POA: Diagnosis not present

## 2020-05-22 DIAGNOSIS — L810 Postinflammatory hyperpigmentation: Secondary | ICD-10-CM | POA: Diagnosis not present

## 2020-06-03 DIAGNOSIS — R1031 Right lower quadrant pain: Secondary | ICD-10-CM | POA: Diagnosis not present

## 2020-06-16 DIAGNOSIS — L709 Acne, unspecified: Secondary | ICD-10-CM | POA: Diagnosis not present

## 2020-06-16 DIAGNOSIS — M549 Dorsalgia, unspecified: Secondary | ICD-10-CM | POA: Diagnosis not present

## 2020-06-16 DIAGNOSIS — R109 Unspecified abdominal pain: Secondary | ICD-10-CM | POA: Diagnosis not present

## 2020-06-16 DIAGNOSIS — I10 Essential (primary) hypertension: Secondary | ICD-10-CM | POA: Diagnosis not present

## 2020-06-22 ENCOUNTER — Ambulatory Visit
Admission: RE | Admit: 2020-06-22 | Discharge: 2020-06-22 | Disposition: A | Discharge status: Home / Self Care | Payer: BLUE CROSS/BLUE SHIELD | Source: Ambulatory Visit | Attending: Obstetrics and Gynecology | Admitting: Obstetrics and Gynecology

## 2020-06-22 ENCOUNTER — Other Ambulatory Visit: Payer: Self-pay

## 2020-06-22 ENCOUNTER — Ambulatory Visit
Admission: RE | Admit: 2020-06-22 | Discharge: 2020-06-22 | Disposition: A | Discharge status: Home / Self Care | Payer: BC Managed Care – PPO | Source: Ambulatory Visit | Attending: Obstetrics and Gynecology | Admitting: Obstetrics and Gynecology

## 2020-06-22 DIAGNOSIS — N6310 Unspecified lump in the right breast, unspecified quadrant: Secondary | ICD-10-CM

## 2020-06-22 DIAGNOSIS — N6320 Unspecified lump in the left breast, unspecified quadrant: Secondary | ICD-10-CM

## 2020-06-22 DIAGNOSIS — Z1231 Encounter for screening mammogram for malignant neoplasm of breast: Secondary | ICD-10-CM

## 2020-07-06 ENCOUNTER — Other Ambulatory Visit: Payer: Self-pay | Admitting: Gastroenterology

## 2020-07-06 ENCOUNTER — Other Ambulatory Visit (HOSPITAL_COMMUNITY): Payer: Self-pay | Admitting: Gastroenterology

## 2020-07-06 DIAGNOSIS — R1084 Generalized abdominal pain: Secondary | ICD-10-CM

## 2020-07-06 DIAGNOSIS — R110 Nausea: Secondary | ICD-10-CM

## 2020-07-21 ENCOUNTER — Ambulatory Visit (HOSPITAL_COMMUNITY): Payer: BC Managed Care – PPO

## 2020-07-23 ENCOUNTER — Other Ambulatory Visit: Payer: Self-pay

## 2020-07-23 ENCOUNTER — Ambulatory Visit (HOSPITAL_COMMUNITY)
Admission: RE | Admit: 2020-07-23 | Discharge: 2020-07-23 | Disposition: A | Discharge status: Home / Self Care | Payer: BC Managed Care – PPO | Source: Ambulatory Visit | Attending: Gastroenterology | Admitting: Gastroenterology

## 2020-07-23 DIAGNOSIS — R1084 Generalized abdominal pain: Secondary | ICD-10-CM | POA: Diagnosis present

## 2020-07-23 DIAGNOSIS — R110 Nausea: Secondary | ICD-10-CM | POA: Insufficient documentation

## 2020-07-23 MED ORDER — TECHNETIUM TC 99M MEBROFENIN IV KIT
5.4000 | PACK | Freq: Once | INTRAVENOUS | Status: AC | PRN
  Administered 2020-07-23: 5.4 via INTRAVENOUS

## 2020-11-03 ENCOUNTER — Other Ambulatory Visit: Payer: Self-pay | Admitting: Obstetrics and Gynecology

## 2020-11-03 DIAGNOSIS — Z09 Encounter for follow-up examination after completed treatment for conditions other than malignant neoplasm: Secondary | ICD-10-CM

## 2020-11-06 ENCOUNTER — Other Ambulatory Visit: Payer: Self-pay | Admitting: Obstetrics and Gynecology

## 2020-11-06 DIAGNOSIS — N6001 Solitary cyst of right breast: Secondary | ICD-10-CM

## 2020-12-22 ENCOUNTER — Ambulatory Visit
Admission: RE | Admit: 2020-12-22 | Discharge: 2020-12-22 | Disposition: A | Discharge status: Home / Self Care | Payer: BC Managed Care – PPO | Source: Ambulatory Visit | Attending: Obstetrics and Gynecology | Admitting: Obstetrics and Gynecology

## 2020-12-22 ENCOUNTER — Other Ambulatory Visit: Payer: Self-pay

## 2020-12-22 DIAGNOSIS — N6001 Solitary cyst of right breast: Secondary | ICD-10-CM

## 2021-01-22 IMAGING — US US BREAST*L* LIMITED INC AXILLA
1 series · 12 of 12 positions shown · non-contrast
Comparison: Previous exam(s).

CLINICAL DATA: 44-year-old female recalled from screening mammogram
dated 05/13/2020 for possible bilateral masses.

EXAM:
DIGITAL DIAGNOSTIC BILATERAL MAMMOGRAM WITH CAD AND TOMO
ULTRASOUND BILATERAL BREAST

[Series 1: us breast*left* limited inc axilla · 0.06mm/px · 12 of 12 slices shown]
[im 1/12]
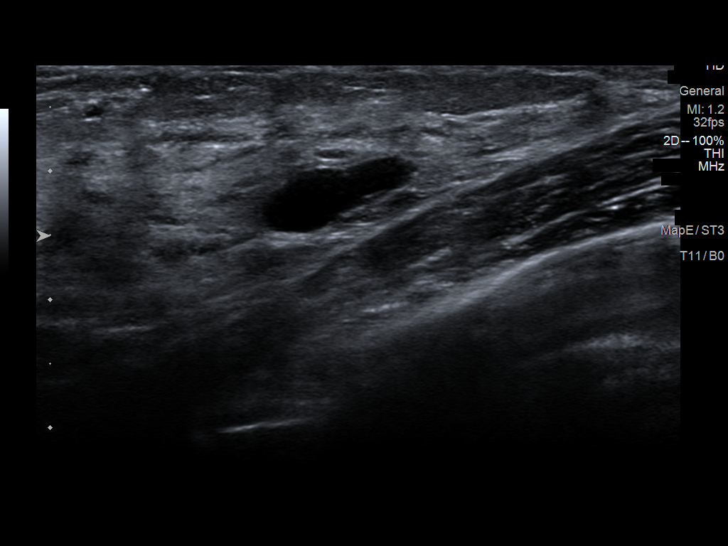
[im 2/12]
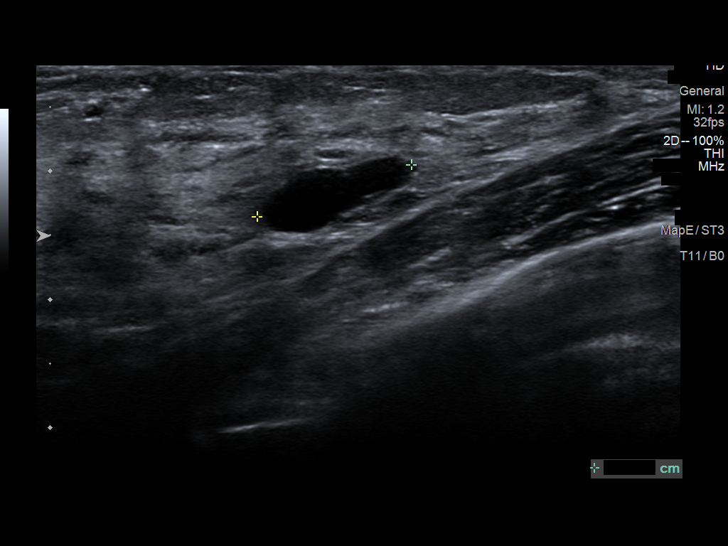
[im 3/12]
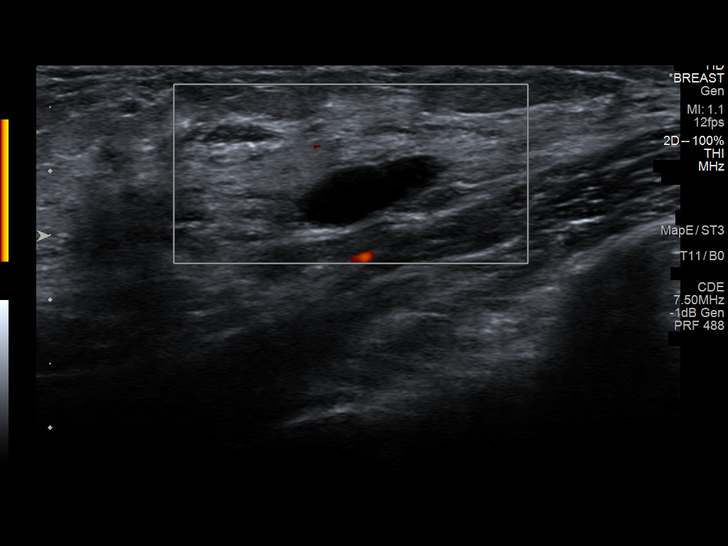
[im 4/12]
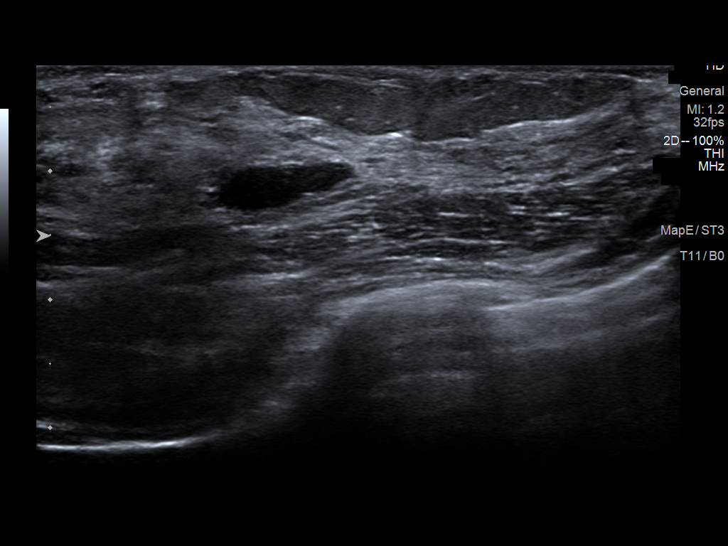
[im 5/12]
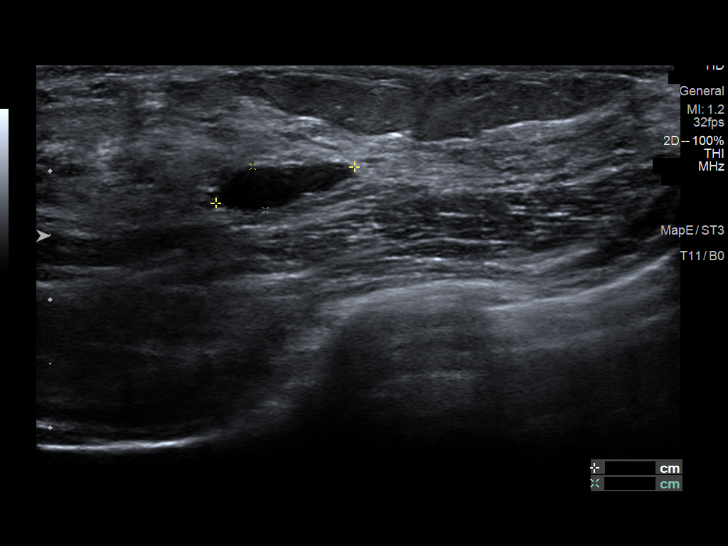
[im 6/12]
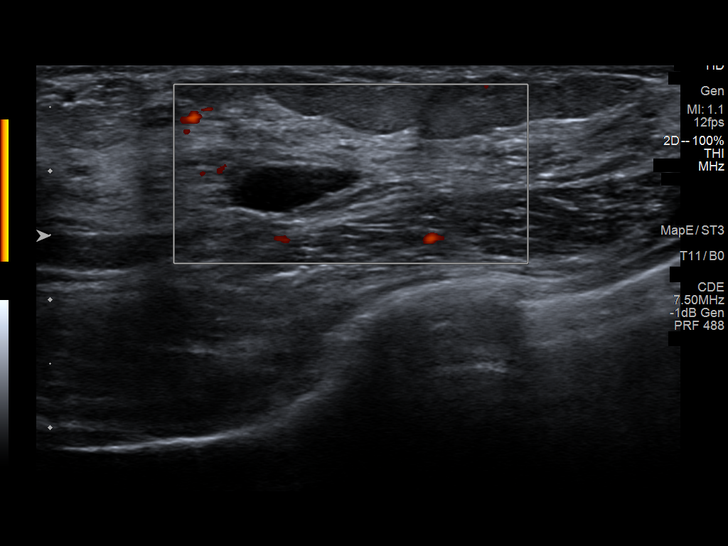
[im 7/12]
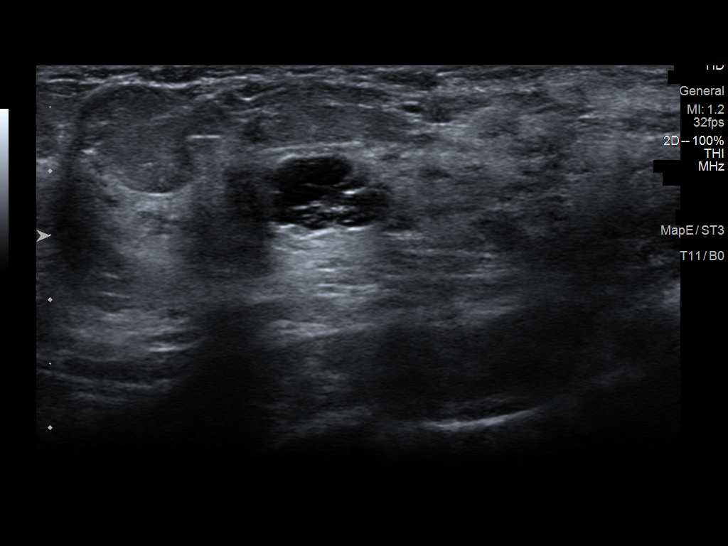
[im 8/12]
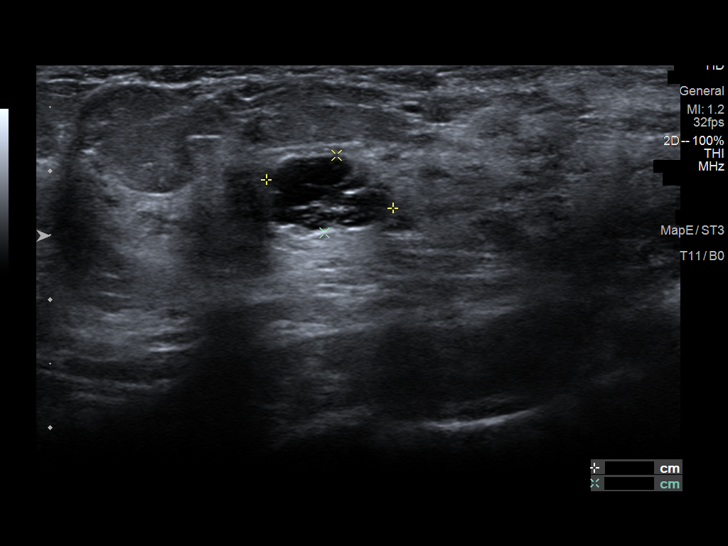
[im 9/12]
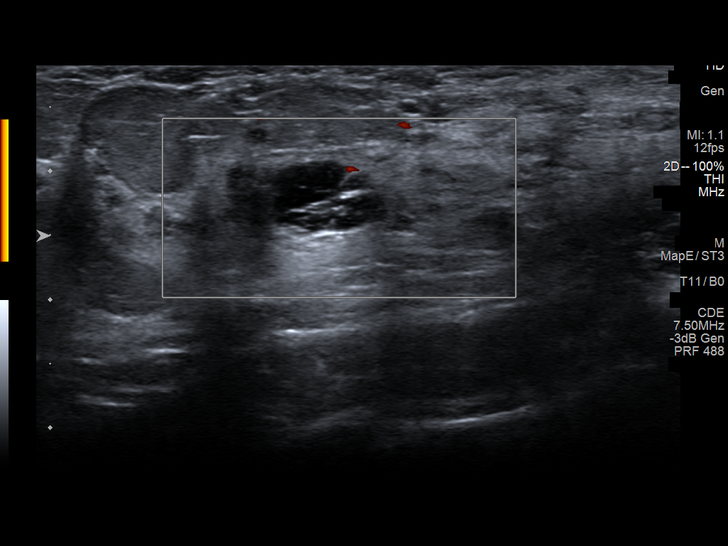
[im 10/12]
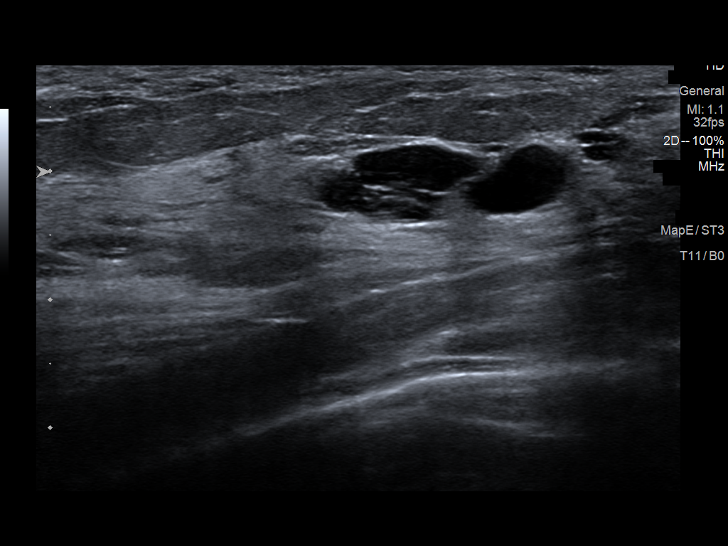
[im 11/12]
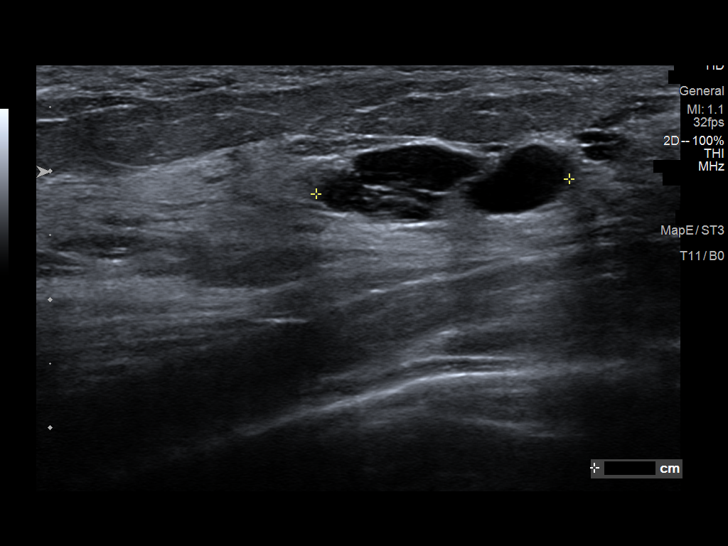
[im 12/12]
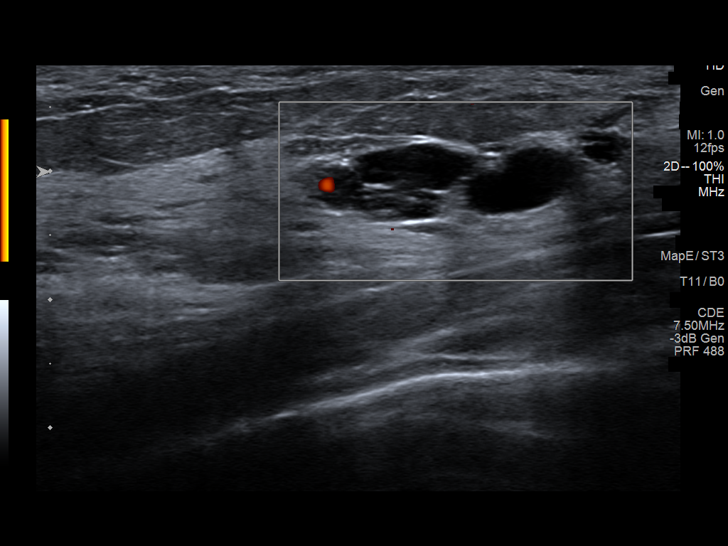

[12 of 12 positions shown; findings below may reference images not displayed]

ACR Breast Density Category c: The breast tissue is heterogeneously
dense, which may obscure small masses.
FINDINGS: There are persistent oval, circumscribed equal density masses in the
upper-outer the and lower inner right breast at mid to posterior
depth. A similar appearing mass is demonstrated in the upper outer
left breast at middle depth. Further evaluation with ultrasound was
performed.

Mammographic images were processed with CAD.

Targeted ultrasound is performed, showing multiple oval,
circumscribed anechoic in nearly anechoic masses at the lower inner
and upper outer quadrants of the right breast. The largest measures
7 x 5 x 5 mm at the 11 o'clock position 3 cm from the nipple and 8 x
6 x 2 mm at the 5 o'clock position 6 cm from the nipple. These all
demonstrate a simple, anechoic appearance. An oval, circumscribed
hypoechoic mass is demonstrated at the [DATE] position 5 cm from the
nipple. It measures 6 x 5 x 3 mm. These correspond well with the
mammographic finding.

Evaluation of the left breast demonstrates oval circumscribed
anechoic and multi-cystic mass at the 2 o'clock position 6 cm from
the nipple. They measure 13 x 11 x 4 mm and 13 x 10 x 6 mm at the 2
o'clock position 8 and 6 mm from the nipple. This corresponds well
with the mammographic findings.
IMPRESSION: 1. Probably benign, probable complicated cyst at the [DATE] position 5
cm from the nipple on the right. Recommendation is for ultrasound
follow-up of this area, which has a slightly more complicated
appearance from the additional bilateral findings.
2. Otherwise, findings consistent with benign bilateral fibrocystic
changes.

RECOMMENDATION:
Right breast ultrasound in 6 months.

I have discussed the findings and recommendations with the patient.
If applicable, a reminder letter will be sent to the patient
regarding the next appointment.

BI-RADS CATEGORY  3: Probably benign.

## 2021-01-22 IMAGING — US US BREAST*R* LIMITED INC AXILLA
2 series · 13 of 25 positions shown · non-contrast
Comparison: Previous exam(s).

CLINICAL DATA: 44-year-old female recalled from screening mammogram
dated 05/13/2020 for possible bilateral masses.

EXAM:
DIGITAL DIAGNOSTIC BILATERAL MAMMOGRAM WITH CAD AND TOMO
ULTRASOUND BILATERAL BREAST

[Series 1: us breast*right* limited inc axilla · 0.05mm/px · 11 of 28 slices shown (1 of 2)]
[im 1/28]
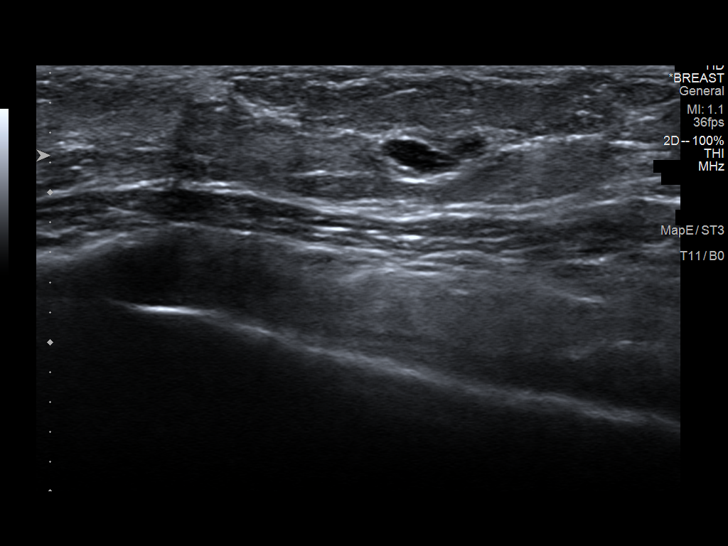
[im 3/28]
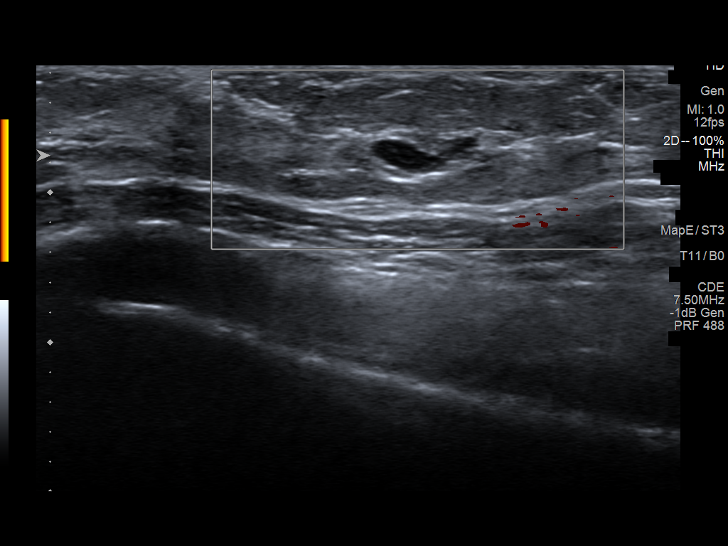
[im 6/28]
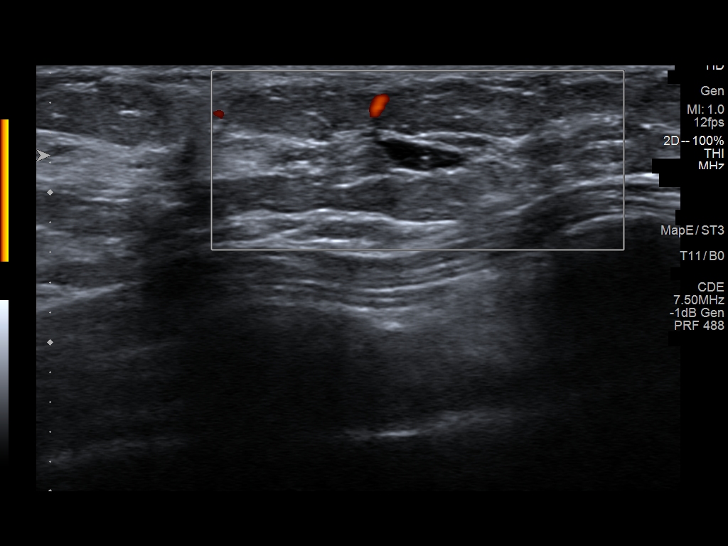
[im 9/28]
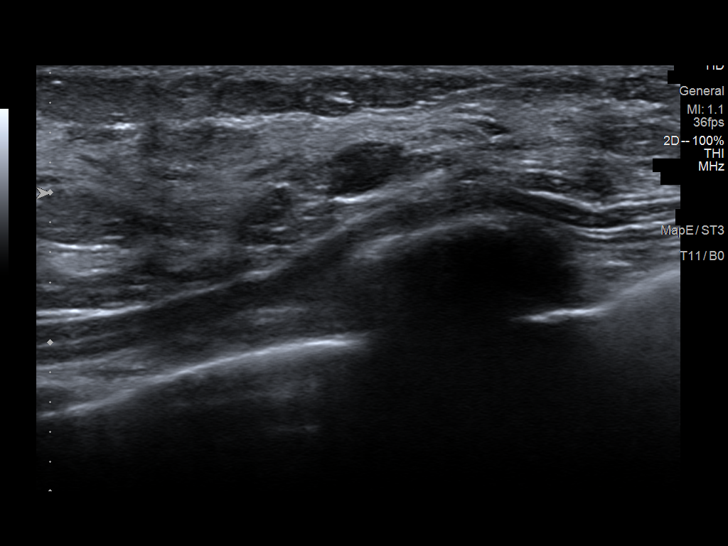
[im 11/28]
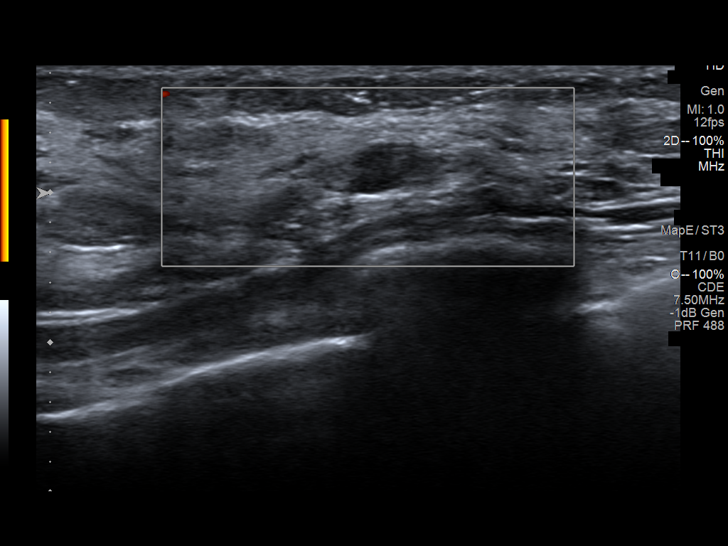
[im 14/28]
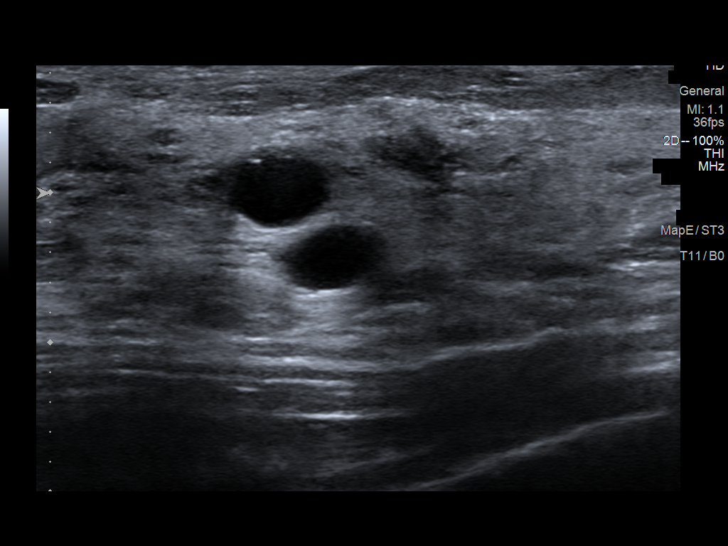
[im 17/28]
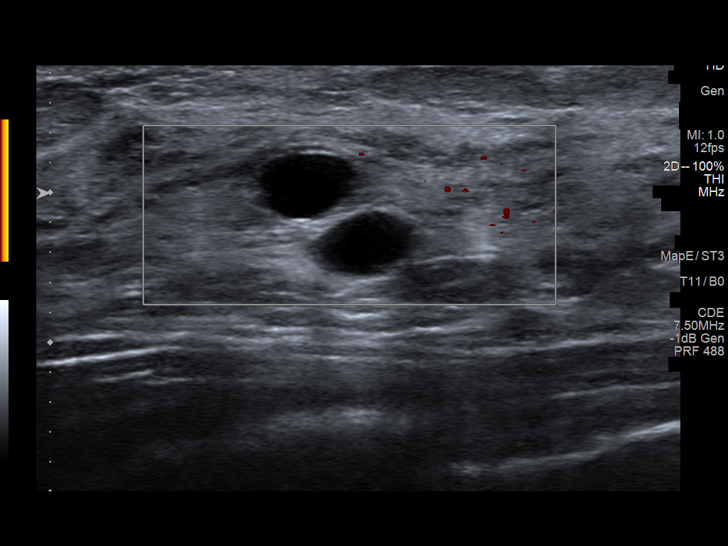
[im 19/28]
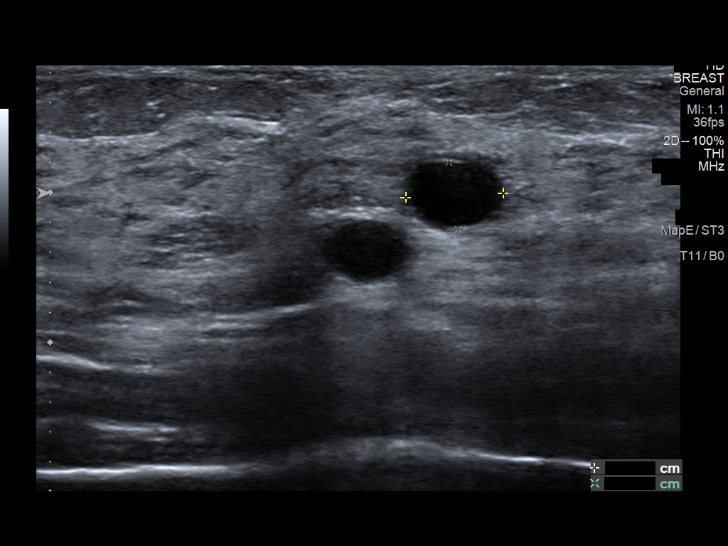
[im 22/28]
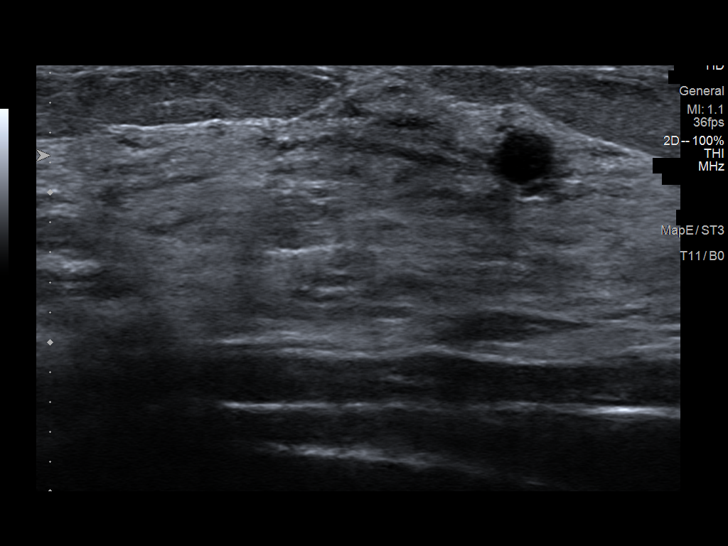
[im 25/28]
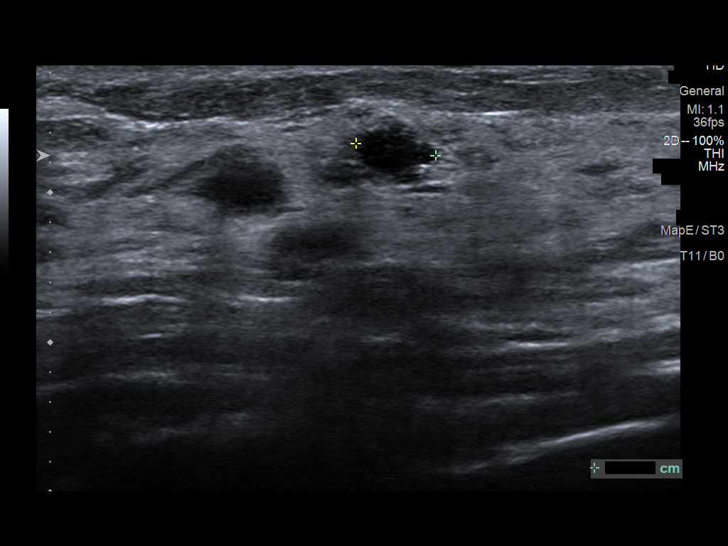
[im 28/28]
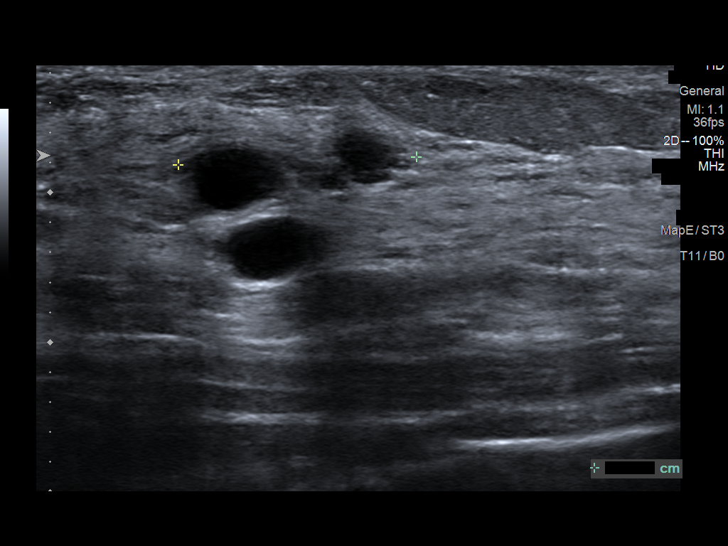

[Series 2: us breast*right* limited inc axilla · 0.05mm/px · 2 of 6 slices shown (2 of 2)]
[im 2/6]
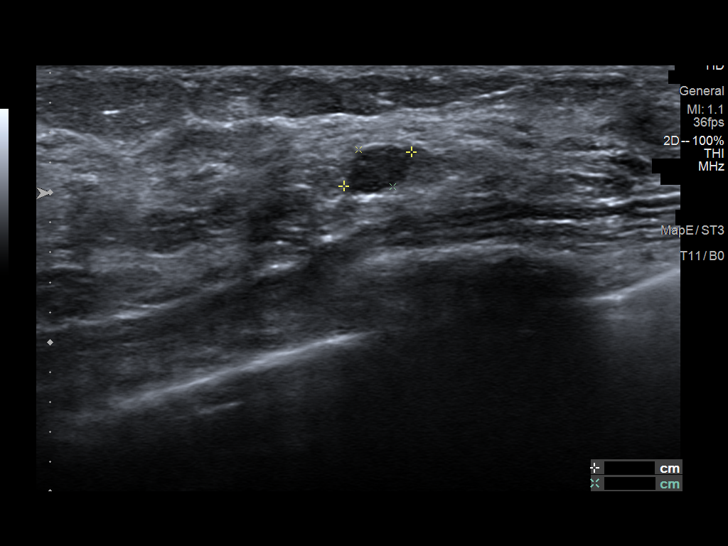
[im 6/6]
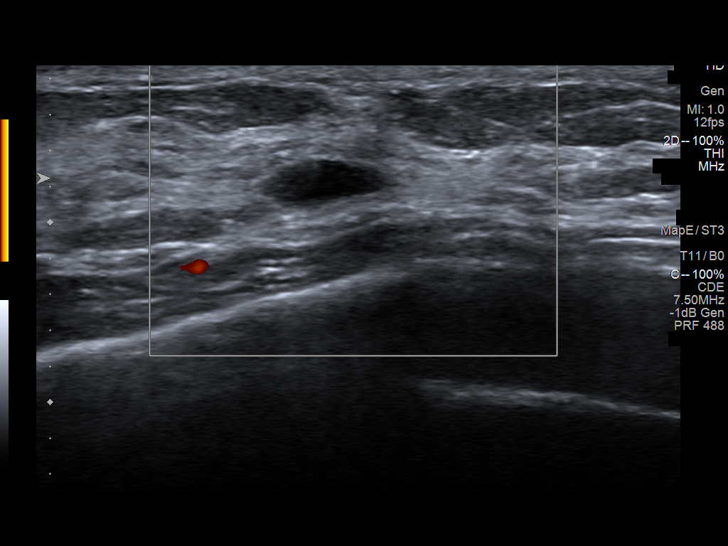

[13 of 25 positions shown; findings below may reference images not displayed]

ACR Breast Density Category c: The breast tissue is heterogeneously
dense, which may obscure small masses.
FINDINGS: There are persistent oval, circumscribed equal density masses in the
upper-outer the and lower inner right breast at mid to posterior
depth. A similar appearing mass is demonstrated in the upper outer
left breast at middle depth. Further evaluation with ultrasound was
performed.

Mammographic images were processed with CAD.

Targeted ultrasound is performed, showing multiple oval,
circumscribed anechoic in nearly anechoic masses at the lower inner
and upper outer quadrants of the right breast. The largest measures
7 x 5 x 5 mm at the 11 o'clock position 3 cm from the nipple and 8 x
6 x 2 mm at the 5 o'clock position 6 cm from the nipple. These all
demonstrate a simple, anechoic appearance. An oval, circumscribed
hypoechoic mass is demonstrated at the [DATE] position 5 cm from the
nipple. It measures 6 x 5 x 3 mm. These correspond well with the
mammographic finding.

Evaluation of the left breast demonstrates oval circumscribed
anechoic and multi-cystic mass at the 2 o'clock position 6 cm from
the nipple. They measure 13 x 11 x 4 mm and 13 x 10 x 6 mm at the 2
o'clock position 8 and 6 mm from the nipple. This corresponds well
with the mammographic findings.
IMPRESSION: 1. Probably benign, probable complicated cyst at the [DATE] position 5
cm from the nipple on the right. Recommendation is for ultrasound
follow-up of this area, which has a slightly more complicated
appearance from the additional bilateral findings.
2. Otherwise, findings consistent with benign bilateral fibrocystic
changes.

RECOMMENDATION:
Right breast ultrasound in 6 months.

I have discussed the findings and recommendations with the patient.
If applicable, a reminder letter will be sent to the patient
regarding the next appointment.

BI-RADS CATEGORY  3: Probably benign.

## 2021-05-03 ENCOUNTER — Inpatient Hospital Stay
Admission: RE | Admit: 2021-05-03 | Discharge: 2021-05-03 | Disposition: A | Discharge status: Home / Self Care | Payer: Self-pay | Source: Ambulatory Visit | Attending: *Deleted | Admitting: *Deleted

## 2021-05-03 ENCOUNTER — Other Ambulatory Visit: Payer: Self-pay | Admitting: *Deleted

## 2021-05-03 DIAGNOSIS — Z1231 Encounter for screening mammogram for malignant neoplasm of breast: Secondary | ICD-10-CM

## 2021-07-24 IMAGING — US US BREAST*R* LIMITED INC AXILLA
1 series · 9 of 9 positions shown · non-contrast
Comparison: Previous exam(s).

CLINICAL DATA: 45-year-old female presenting for six-month
follow-up of a probably benign right breast mass.

EXAM:
ULTRASOUND OF THE RIGHT BREAST

[Series 1: us breast*right* limited inc axilla · 0.04mm/px · 9 of 9 slices shown]
[im 1/9]
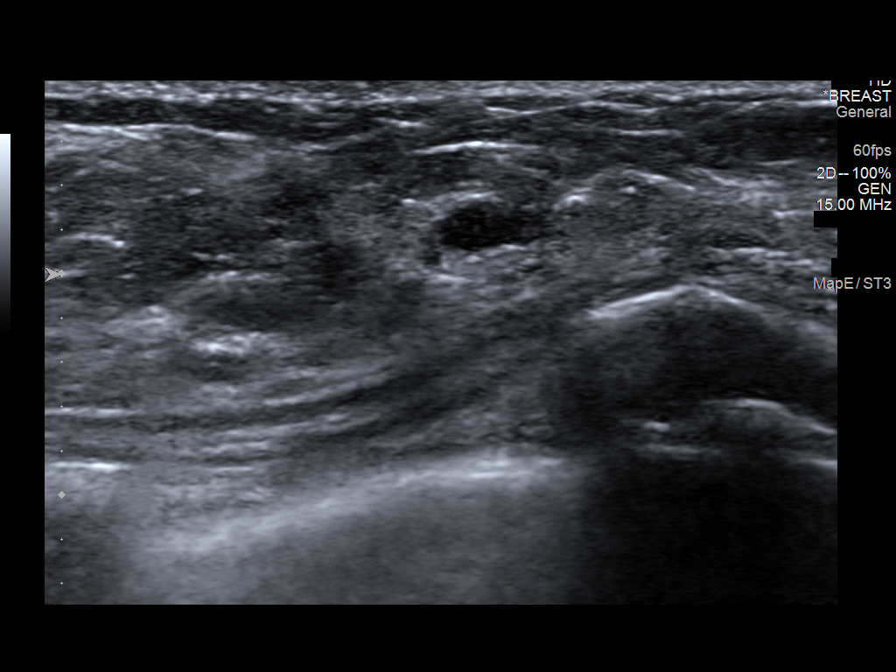
[im 2/9]
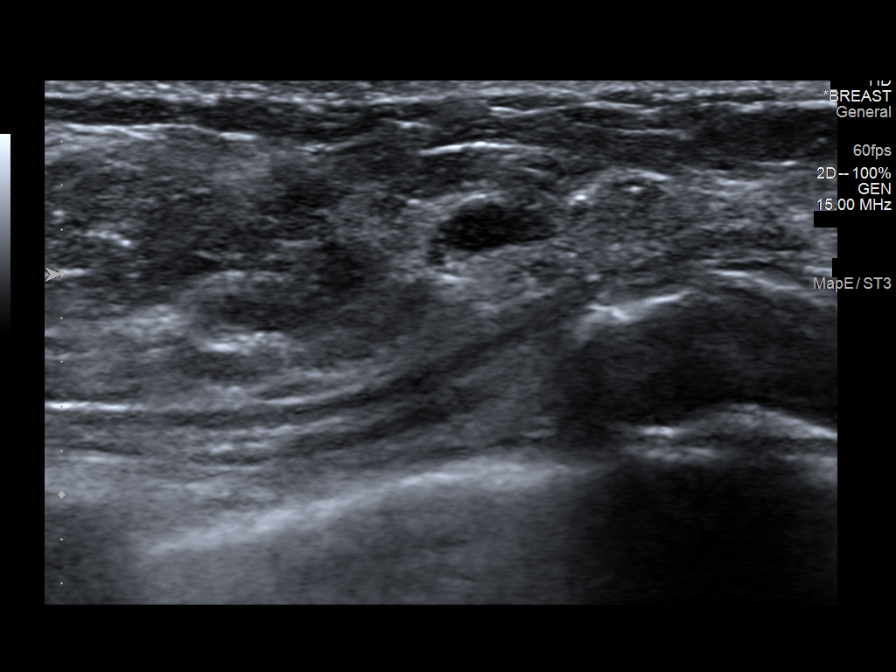
[im 3/9]
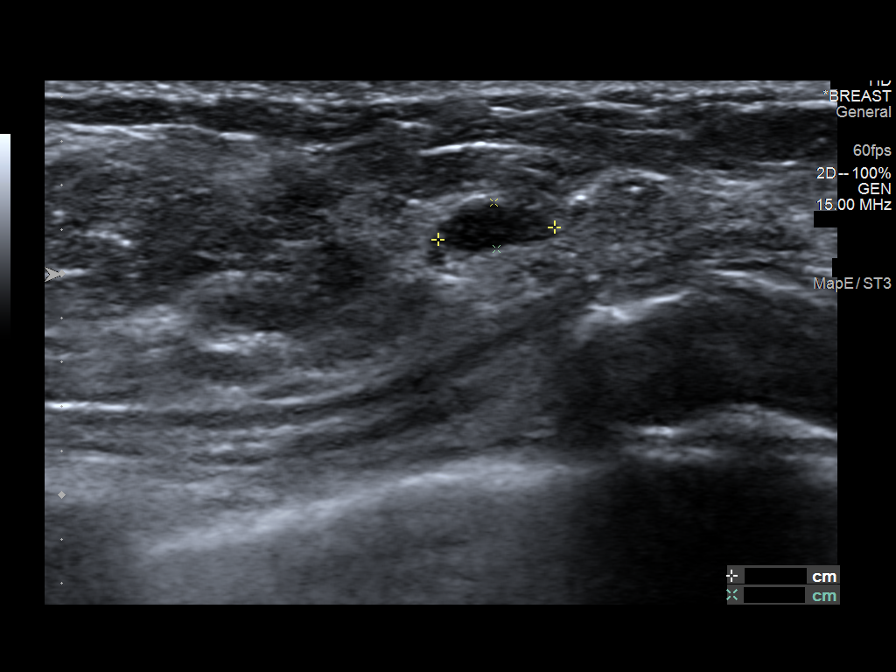
[im 4/9]
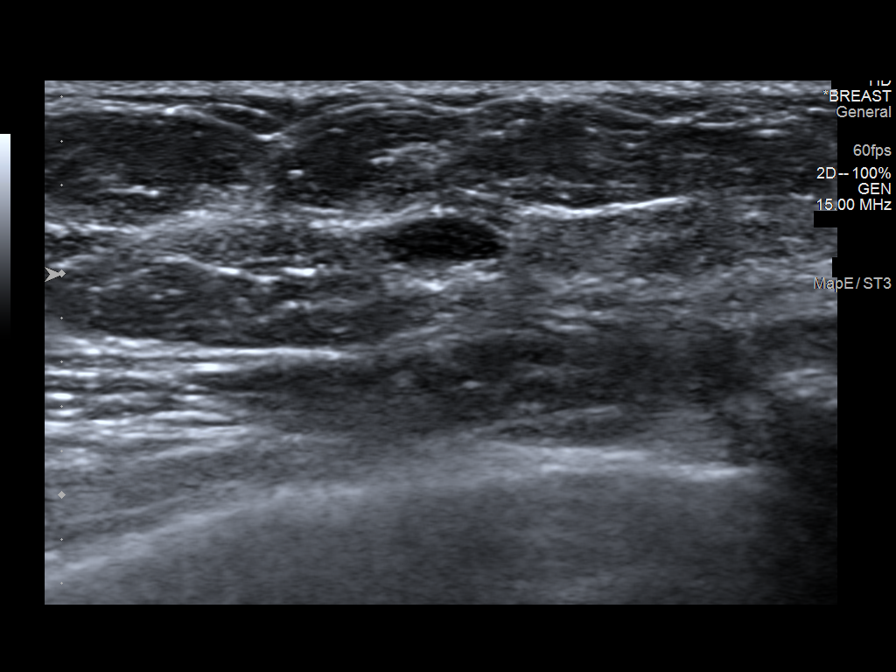
[im 5/9]
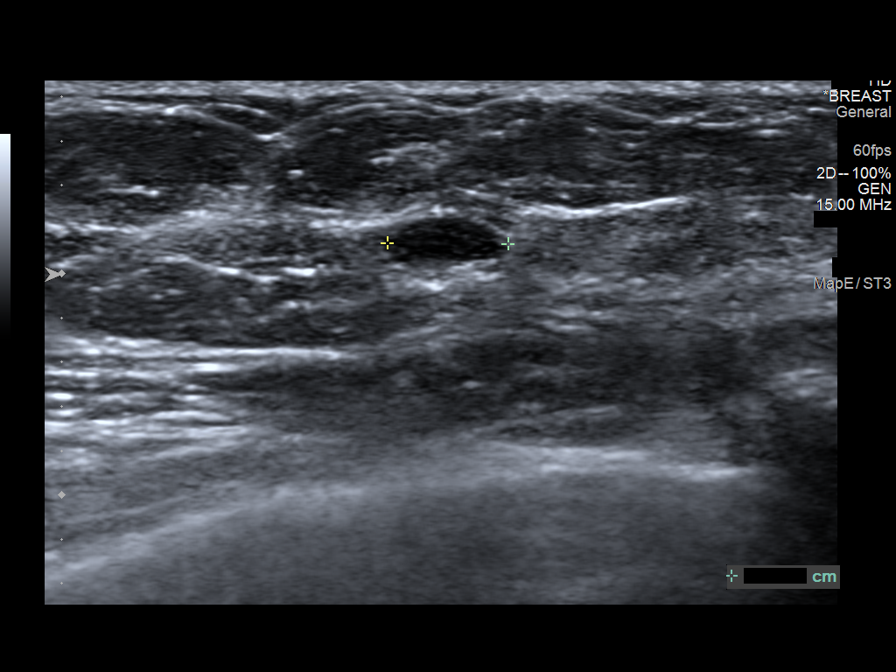
[im 6/9]
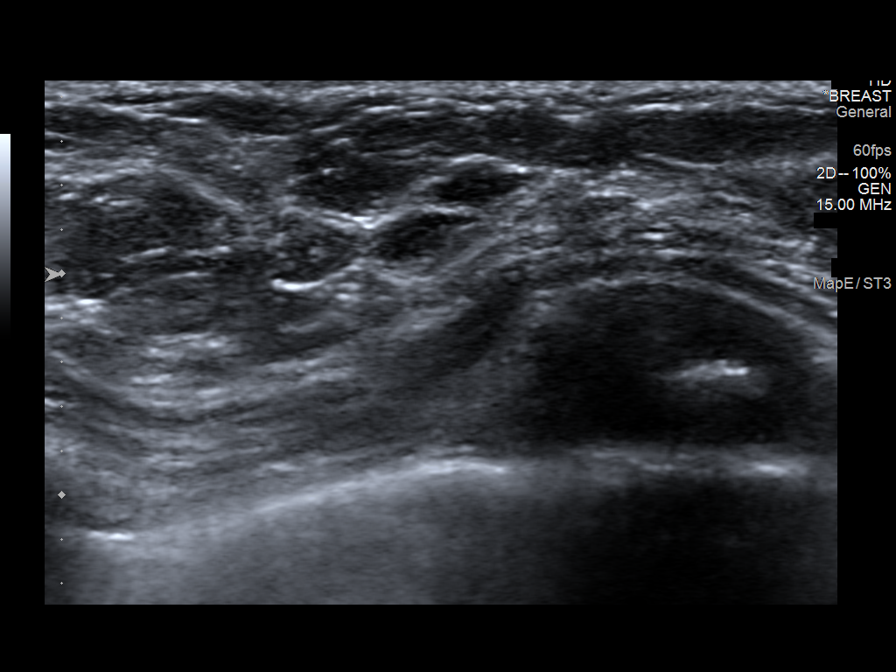
[im 7/9]
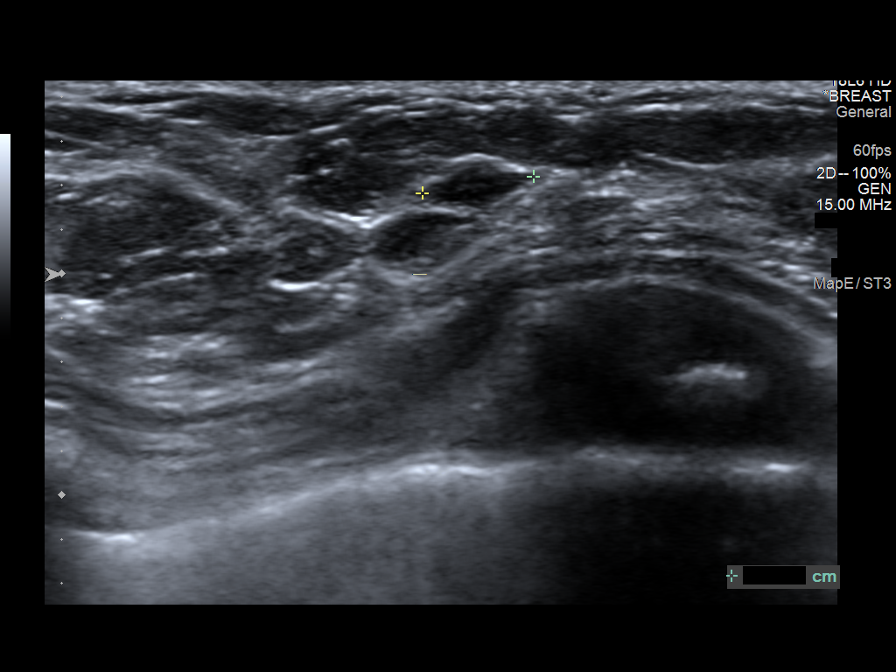
[im 8/9]
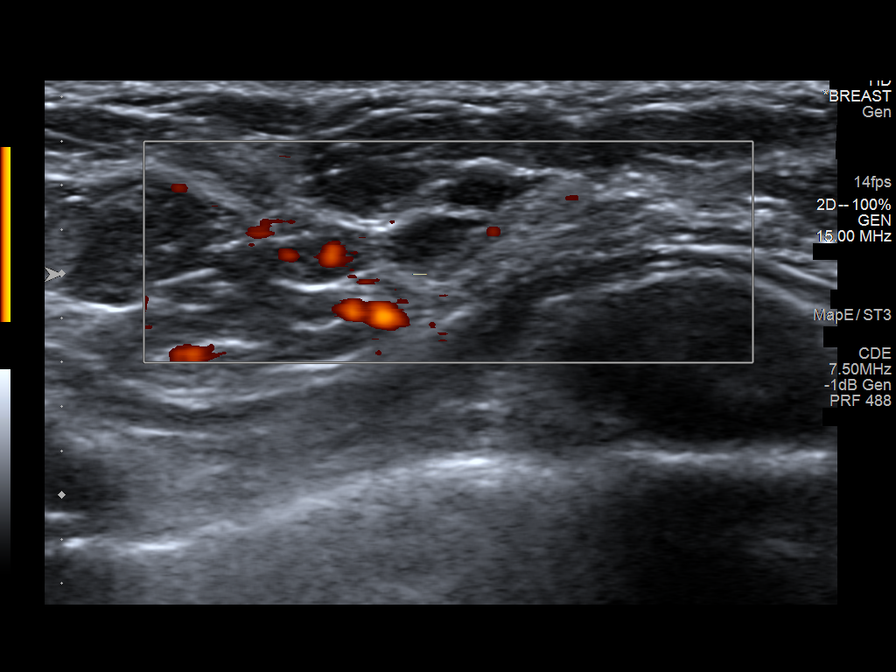
[im 9/9]
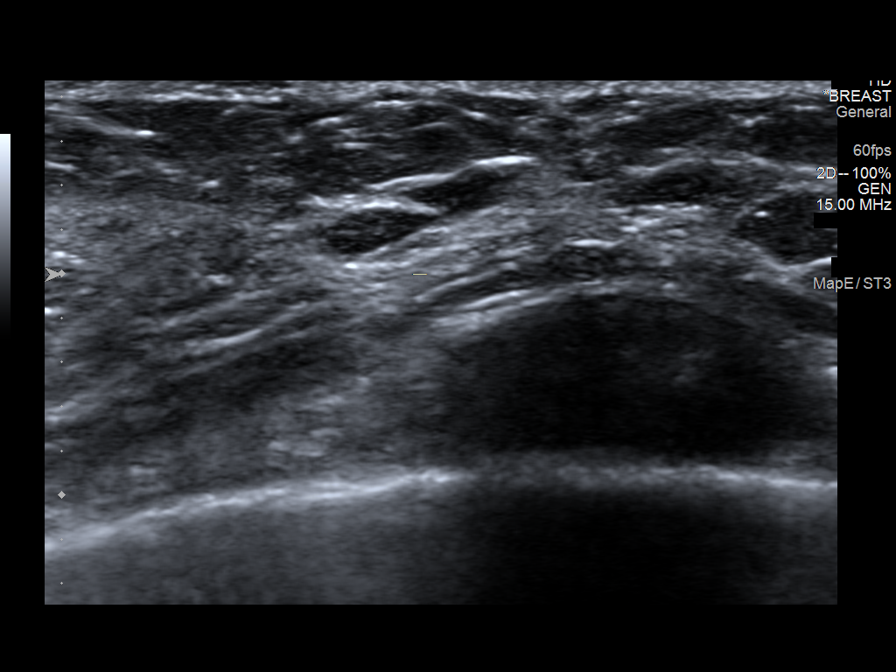

[9 of 9 positions shown; findings below may reference images not displayed]

FINDINGS: Targeted ultrasound is performed in the right breast at 5:30 o'clock
5 cm from the nipple demonstrating a cluster of cysts with the
dominant cyst measuring 0.5 x 0.2 x 0.6 cm. This may correspond to
the cluster of cysts previously seen at 5 o'clock. No additional
solid mass is identified. The previously seen likely complicated
cyst at 5:30 o'clock may have resolved.
IMPRESSION: Benign cluster of cysts in the right breast at 5:30 o'clock.

RECOMMENDATION:
Return to routine annual screening mammography which will be due in
April 2021.

I have discussed the findings and recommendations with the patient.
If applicable, a reminder letter will be sent to the patient
regarding the next appointment.

BI-RADS CATEGORY  2: Benign.
